# Patient Record
Sex: Female | Born: 1955 | Race: White | Hispanic: No | Marital: Married | Smoking: Never smoker
Health system: Southern US, Community
[De-identification: ages and names within clinical notes are randomized; demographics above are authoritative.]

## PROBLEM LIST (undated history)

## (undated) DIAGNOSIS — D259 Leiomyoma of uterus, unspecified: Secondary | ICD-10-CM

## (undated) DIAGNOSIS — I7090 Unspecified atherosclerosis: Secondary | ICD-10-CM

## (undated) DIAGNOSIS — R87619 Unspecified abnormal cytological findings in specimens from cervix uteri: Secondary | ICD-10-CM

## (undated) DIAGNOSIS — R809 Proteinuria, unspecified: Secondary | ICD-10-CM

## (undated) DIAGNOSIS — M810 Age-related osteoporosis without current pathological fracture: Secondary | ICD-10-CM

## (undated) DIAGNOSIS — D509 Iron deficiency anemia, unspecified: Secondary | ICD-10-CM

## (undated) DIAGNOSIS — R300 Dysuria: Secondary | ICD-10-CM

## (undated) DIAGNOSIS — Z87898 Personal history of other specified conditions: Secondary | ICD-10-CM

## (undated) HISTORY — DX: Proteinuria, unspecified: R80.9

## (undated) HISTORY — PX: TONGUE SURGERY: SHX810

## (undated) HISTORY — DX: Dysuria: R30.0

## (undated) HISTORY — DX: Unspecified abnormal cytological findings in specimens from cervix uteri: R87.619

## (undated) HISTORY — DX: Personal history of other specified conditions: Z87.898

## (undated) HISTORY — PX: UTERINE FIBROID EMBOLIZATION: SHX825

## (undated) HISTORY — DX: Iron deficiency anemia, unspecified: D50.9

## (undated) HISTORY — PX: BREAST LUMPECTOMY: SHX2

## (undated) HISTORY — DX: Leiomyoma of uterus, unspecified: D25.9

## (undated) HISTORY — DX: Unspecified atherosclerosis: I70.90

## (undated) HISTORY — DX: Age-related osteoporosis without current pathological fracture: M81.0

---

## 1998-07-08 ENCOUNTER — Other Ambulatory Visit: Admission: RE | Admit: 1998-07-08 | Discharge: 1998-07-08 | Payer: Self-pay | Admitting: *Deleted

## 1999-07-16 ENCOUNTER — Other Ambulatory Visit: Admission: RE | Admit: 1999-07-16 | Discharge: 1999-07-16 | Payer: Self-pay | Admitting: *Deleted

## 2001-01-25 ENCOUNTER — Other Ambulatory Visit: Admission: RE | Admit: 2001-01-25 | Discharge: 2001-01-25 | Payer: Self-pay | Admitting: *Deleted

## 2002-01-13 ENCOUNTER — Other Ambulatory Visit: Admission: RE | Admit: 2002-01-13 | Discharge: 2002-01-13 | Payer: Self-pay | Admitting: *Deleted

## 2005-10-07 ENCOUNTER — Ambulatory Visit: Payer: Self-pay | Admitting: Internal Medicine

## 2005-10-08 ENCOUNTER — Ambulatory Visit: Payer: Self-pay | Admitting: Internal Medicine

## 2005-12-21 LAB — HM COLONOSCOPY: HM Colonoscopy: NORMAL

## 2006-01-15 ENCOUNTER — Other Ambulatory Visit: Admission: RE | Admit: 2006-01-15 | Discharge: 2006-01-15 | Payer: Self-pay | Admitting: Obstetrics & Gynecology

## 2007-06-22 ENCOUNTER — Other Ambulatory Visit: Admission: RE | Admit: 2007-06-22 | Discharge: 2007-06-22 | Payer: Self-pay | Admitting: Obstetrics & Gynecology

## 2007-11-03 LAB — CONVERTED CEMR LAB: Pap Smear: NORMAL

## 2008-10-29 ENCOUNTER — Ambulatory Visit: Payer: Self-pay | Admitting: Internal Medicine

## 2008-10-29 DIAGNOSIS — D509 Iron deficiency anemia, unspecified: Secondary | ICD-10-CM | POA: Insufficient documentation

## 2008-10-29 DIAGNOSIS — M810 Age-related osteoporosis without current pathological fracture: Secondary | ICD-10-CM | POA: Insufficient documentation

## 2008-10-29 HISTORY — DX: Iron deficiency anemia, unspecified: D50.9

## 2008-10-29 HISTORY — DX: Age-related osteoporosis without current pathological fracture: M81.0

## 2008-10-30 ENCOUNTER — Encounter: Payer: Self-pay | Admitting: Internal Medicine

## 2008-10-30 LAB — CONVERTED CEMR LAB
AST: 24 units/L (ref 0–37)
Alkaline Phosphatase: 68 units/L (ref 39–117)
Basophils Absolute: 0 10*3/uL (ref 0.0–0.1)
Bilirubin, Direct: 0.1 mg/dL (ref 0.0–0.3)
Calcium: 9.9 mg/dL (ref 8.4–10.5)
Eosinophils Absolute: 0.1 10*3/uL (ref 0.0–0.7)
Eosinophils Relative: 1.5 % (ref 0.0–5.0)
GFR calc non Af Amer: 79.66 mL/min (ref >60)
Glucose, Bld: 117 mg/dL — ABNORMAL HIGH (ref 70–99)
HCT: 44.2 % (ref 36.0–46.0)
Ketones, ur: NEGATIVE mg/dL
Lymphs Abs: 1.8 10*3/uL (ref 0.7–4.0)
MCHC: 33.7 g/dL (ref 30.0–36.0)
MCV: 88.6 fL (ref 78.0–100.0)
Monocytes Absolute: 0.3 10*3/uL (ref 0.1–1.0)
Platelets: 211 10*3/uL (ref 150.0–400.0)
RDW: 11.9 % (ref 11.5–14.6)
Sodium: 141 meq/L (ref 135–145)
Specific Gravity, Urine: 1.015 (ref 1.000–1.030)
Total Bilirubin: 0.7 mg/dL (ref 0.3–1.2)
Total CHOL/HDL Ratio: 2
Urobilinogen, UA: 0.2 (ref 0.0–1.0)
Vit D, 25-Hydroxy: 60 ng/mL (ref 30–89)

## 2008-11-03 ENCOUNTER — Encounter: Payer: Self-pay | Admitting: Internal Medicine

## 2008-11-05 LAB — CONVERTED CEMR LAB: PTH: 63.8 pg/mL (ref 14.0–72.0)

## 2009-03-29 ENCOUNTER — Ambulatory Visit: Payer: Self-pay | Admitting: Internal Medicine

## 2009-03-29 DIAGNOSIS — R300 Dysuria: Secondary | ICD-10-CM | POA: Insufficient documentation

## 2009-03-29 HISTORY — DX: Dysuria: R30.0

## 2009-03-29 LAB — CONVERTED CEMR LAB
Bilirubin Urine: NEGATIVE
Glucose, Urine, Semiquant: NEGATIVE
Leukocytes, UA: NEGATIVE
Nitrite: NEGATIVE
Protein, U semiquant: NEGATIVE
Specific Gravity, Urine: 1.01
Urobilinogen, UA: 0.2
WBC Urine, dipstick: NEGATIVE
pH: 6.5

## 2009-03-30 ENCOUNTER — Encounter: Payer: Self-pay | Admitting: Internal Medicine

## 2010-03-06 NOTE — Assessment & Plan Note (Signed)
Summary: BLOOD IN URINE/NWS   Vital Signs:  Patient profile:   55 year old female Height:      65 inches Weight:      154.75 pounds BMI:     25.84 O2 Sat:      97 % on Room air Temp:     97.7 degrees F oral Pulse rate:   80 / minute BP sitting:   104 / 74  (left arm) Cuff size:   regular  Vitals Entered ByZella Ball Ewing (March 29, 2009 8:04 AM)  O2 Flow:  Room air CC: Blood in urine, urgency to urinate/RE   CC:  Blood in urine and urgency to urinate/RE.  History of Present Illness: had dysuria and small blood while traveling on feb 13, no fever, and mostly improved but still has some minor  urgency and frequency and some ? retention, and an unsual small stress incontinence;  no worsening back pain, n/v , chills or abd pain.  has been drinking more fluids and seemed to help.  No hx of renal stones and more pain than above.  Pt denies CP, sob, doe, wheezing, orthopnea, pnd, worsening LE edema, palps, dizziness or syncope  Pt denies new neuro symptoms such as headache, facial or extremity weakness  No GYn complaints or vaginal d/c or pain.    Problems Prior to Update: 1)  Dysuria  (ICD-788.1) 2)  Anemia-iron Deficiency  (ICD-280.9) 3)  Preventive Health Care  (ICD-V70.0) 4)  Osteoporosis  (ICD-733.00)  Medications Prior to Update: 1)  Fosamax 70 Mg Tabs (Alendronate Sodium) .... Once Weekly 2)  Calcium 1200gm 3)  Womens Multivitamin 4)  Fish Oil  Current Medications (verified): 1)  Evista 60 Mg Tabs (Raloxifene Hcl) .Marland Kitchen.. 1po Once Daily 2)  Calcium 1200gm 3)  Womens Multivitamin 4)  Fish Oil 5)  Evista 60 Mg Tabs (Raloxifene Hcl) .Marland Kitchen.. 1 By Mouth Once Daily  Allergies (verified): No Known Drug Allergies  Past History:  Past Medical History: Last updated: 10/29/2008 DJD hands Osteoporosis  - on Bisphosphonate for approx 2007 uterine fibroid Anemia-iron deficiency hx of fibrocystic breast dz  Past Surgical History: Last updated: 10/29/2008 s/p breast lump approx  1990 - benign - left s/p fibroid embolization  Social History: Last updated: 10/29/2008 Married no children work - Pensions consultant travel and Airline pilot Never Smoked Alcohol use-no  Risk Factors: Smoking Status: never (10/29/2008)  Review of Systems       all otherwise negative per pt -  Physical Exam  General:  alert and overweight-appearing.   Head:  normocephalic and atraumatic.   Eyes:  vision grossly intact, pupils equal, and pupils round.   Ears:  R ear normal and L ear normal.   Nose:  no external deformity and no nasal discharge.   Mouth:  no gingival abnormalities and pharynx pink and moist.   Neck:  supple and no masses.   Lungs:  normal respiratory effort and normal breath sounds.   Heart:  normal rate and regular rhythm.   Abdomen:  soft, non-tender, and normal bowel sounds.   Extremities:  no edema, no erythema    Impression & Recommendations:  Problem # 1:  DYSURIA (ICD-788.1)  minor , improved overall with small hematuria recently (trace pink per pt) with benign exam now;  no constitutional symptoms;  will check UA micro and cx , hold antibx pending culture results, and consider hematuria w/u for any further signs/pt agrees and declines ct/urology at this time  Orders: T-Culture, Urine ([id number])  TLB-Udip w/ Micro (81001-[id number])  Complete Medication List: 1)  Evista 60 Mg Tabs (Raloxifene hcl) .Marland Kitchen.. 1po once daily 2)  Calcium 1200gm  3)  Womens Multivitamin  4)  Fish Oil  5)  Evista 60 Mg Tabs (Raloxifene hcl) .Marland Kitchen.. 1 by mouth once daily  Other Orders: UA Dipstick W/ Micro (manual) (16109)  Patient Instructions: 1)  your urine will be sent for culture and micro analysis today 2)  Continue all previous medications as before this visit  3)  You will antibiotic sent to [facility] if culture shows UTI 4)  Please schedule a follow-up appointment as needed. Prescriptions: EVISTA 60 MG TABS (RALOXIFENE HCL) 1po once daily  #30 x 11   Entered and  Authorized by:   Corwin Levins MD   Signed by:   Corwin Levins MD on 03/29/2009   Method used:   Faxed to ...       [facility] (retail)       [phone]       [place], [place]  [address]       Ph: [phone]       Fax: [phone]   RxID:   [id number]   Laboratory Results   Urine Tests    Routine Urinalysis   Color: lt. yellow Appearance: Clear Glucose: negative   (Normal Range: Negative) Bilirubin: negative   (Normal Range: Negative) Ketone: negative   (Normal Range: Negative) Spec. Gravity: 1.010   (Normal Range: 1.003-1.035) Blood: moderate   (Normal Range: Negative) pH: 6.5   (Normal Range: 5.0-8.0) Protein: negative   (Normal Range: Negative) Urobilinogen: 0.2   (Normal Range: 0-1) Nitrite: negative   (Normal Range: Negative) Leukocyte Esterace: negative   (Normal Range: Negative)

## 2010-11-11 ENCOUNTER — Encounter: Payer: Self-pay | Admitting: Endocrinology

## 2010-11-11 ENCOUNTER — Ambulatory Visit (INDEPENDENT_AMBULATORY_CARE_PROVIDER_SITE_OTHER): Payer: BC Managed Care – PPO | Admitting: Endocrinology

## 2010-11-11 VITALS — BP 126/72 | HR 87 | Temp 98.8°F | Ht 66.0 in | Wt 155.0 lb

## 2010-11-11 DIAGNOSIS — J069 Acute upper respiratory infection, unspecified: Secondary | ICD-10-CM

## 2010-11-11 MED ORDER — DOXYCYCLINE HYCLATE 100 MG PO TABS: 100.0000 mg | ORAL_TABLET | Freq: Two times a day (BID) | ORAL | Status: AC

## 2010-11-11 MED ORDER — BENZONATATE 200 MG PO CAPS: 200.0000 mg | ORAL_CAPSULE | Freq: Three times a day (TID) | ORAL | Status: AC | PRN

## 2010-11-11 NOTE — Progress Notes (Signed)
ha Subjective:    Patient ID: Casey Conley, female    DOB: 14-Oct-1955, 55 y.o.   MRN: [id number]  HPI Pt states 3 days of slight prod-quality cough in the chest, and assoc purulent rhinorrhea.   Past Medical History  Diagnosis Date  . ANEMIA-IRON DEFICIENCY 10/29/2008  . Dysuria 03/29/2009  . OSTEOPOROSIS 10/29/2008  . Osteoporosis     on Bisphosphonate for aprox 2007  . Fibroid, uterine   . History of fibrocystic disease of breast     Past Surgical History  Procedure Date  . Uterine fibroid embolization     s/p  . Breast lumpectomy approx 1990    benign-left    History   Social History  . Marital Status: Married    Spouse Name: N/A    Number of Children: 0  . Years of Education: N/A   Occupational History  . Rug Importer/Frequent Travel and Sales    Social History Main Topics  . Smoking status: Never Smoker   . Smokeless tobacco: Not on file  . Alcohol Use: No  . Drug Use:   . Sexually Active:    Other Topics Concern  . Not on file   Social History Narrative   No siblings     No current outpatient prescriptions on file prior to visit.    No Known Allergies  Family History  Problem Relation Age of Onset  . Cancer Mother     Breast Cancer  . Arthritis Mother   . Dementia Mother   . Transient ischemic attack Mother   . Cancer Father     Lung Cancer, Colon Cancer  . Diabetes Father   . Hyperlipidemia Father   . Heart disease Father     CAD/CABG   BP 126/72  Pulse 87  Temp(Src) 98.8 F (37.1 C) (Oral)  Ht 5\' 6"  (1.676 m)  Wt 155 lb (70.308 kg)  BMI 25.02 kg/m2  SpO2 97%  Review of Systems Denies fever    Objective:   Physical Exam VITAL SIGNS:  See vs page GENERAL: no distress head: no deformity eyes: no periorbital swelling, no proptosis external nose and ears are normal mouth: pharynx is red and swollen Ears: Both eac's are normal.  Left tm is red (right is normal). NECK: There is no palpable thyroid enlargement.  No thyroid nodule is  palpable.  No palpable lymphadenopathy at the anterior neck. LUNGS:  Clear to auscultation     Assessment & Plan:  Glenford Peers, new

## 2010-11-11 NOTE — Patient Instructions (Signed)
i have sent a prescription to your pharmacy, for an antibiotic, and non-drowsy cough pills. Loratadine-d (non-prescription) will help your congestion. I hope you feel better soon.  If you don't feel better by next week, please call dr Jonny Ruiz.

## 2011-01-16 ENCOUNTER — Ambulatory Visit (INDEPENDENT_AMBULATORY_CARE_PROVIDER_SITE_OTHER): Payer: BC Managed Care – PPO

## 2011-01-16 DIAGNOSIS — Z23 Encounter for immunization: Secondary | ICD-10-CM

## 2011-01-19 ENCOUNTER — Ambulatory Visit: Payer: BC Managed Care – PPO | Admitting: Internal Medicine

## 2011-06-23 ENCOUNTER — Encounter: Payer: Self-pay | Admitting: Internal Medicine

## 2011-11-07 ENCOUNTER — Ambulatory Visit (INDEPENDENT_AMBULATORY_CARE_PROVIDER_SITE_OTHER): Payer: BC Managed Care – PPO | Admitting: Family Medicine

## 2011-11-07 VITALS — BP 142/90 | HR 74 | Temp 97.8°F | Resp 16 | Ht 66.0 in | Wt 155.0 lb

## 2011-11-07 DIAGNOSIS — R300 Dysuria: Secondary | ICD-10-CM

## 2011-11-07 LAB — POCT URINALYSIS DIPSTICK
Bilirubin, UA: NEGATIVE
Glucose, UA: NEGATIVE
Ketones, UA: NEGATIVE
Nitrite, UA: NEGATIVE
Protein, UA: NEGATIVE
Spec Grav, UA: 1.015
Urobilinogen, UA: 0.2
pH, UA: 7.5

## 2011-11-07 LAB — POCT UA - MICROSCOPIC ONLY
Bacteria, U Microscopic: NEGATIVE
Casts, Ur, LPF, POC: NEGATIVE
Crystals, Ur, HPF, POC: NEGATIVE
Mucus, UA: NEGATIVE
RBC, urine, microscopic: NEGATIVE
Yeast, UA: NEGATIVE

## 2011-11-07 MED ORDER — CIPROFLOXACIN HCL 500 MG PO TABS: 500.0000 mg | ORAL_TABLET | Freq: Two times a day (BID) | ORAL | Status: DC

## 2011-11-07 NOTE — Patient Instructions (Addendum)
Urinary Tract Infection Urinary tract infections (UTIs) can develop anywhere along your urinary tract. Your urinary tract is your body's drainage system for removing wastes and extra water. Your urinary tract includes two kidneys, two ureters, a bladder, and a urethra. Your kidneys are a pair of bean-shaped organs. Each kidney is about the size of your fist. They are located below your ribs, one on each side of your spine. CAUSES Infections are caused by microbes, which are microscopic organisms, including fungi, viruses, and bacteria. These organisms are so small that they can only be seen through a microscope. Bacteria are the microbes that most commonly cause UTIs. SYMPTOMS  Symptoms of UTIs may vary by age and gender of the patient and by the location of the infection. Symptoms in young women typically include a frequent and intense urge to urinate and a painful, burning feeling in the bladder or urethra during urination. Older women and men are more likely to be tired, shaky, and weak and have muscle aches and abdominal pain. A fever may mean the infection is in your kidneys. Other symptoms of a kidney infection include pain in your back or sides below the ribs, nausea, and vomiting. DIAGNOSIS To diagnose a UTI, your caregiver will ask you about your symptoms. Your caregiver also will ask to provide a urine sample. The urine sample will be tested for bacteria and white blood cells. White blood cells are made by your body to help fight infection. TREATMENT  Typically, UTIs can be treated with medication. Because most UTIs are caused by a bacterial infection, they usually can be treated with the use of antibiotics. The choice of antibiotic and length of treatment depend on your symptoms and the type of bacteria causing your infection. HOME CARE INSTRUCTIONS  If you were prescribed antibiotics, take them exactly as your caregiver instructs you. Finish the medication even if you feel better after you  have only taken some of the medication.  Drink enough water and fluids to keep your urine clear or pale yellow.  Avoid caffeine, tea, and carbonated beverages. They tend to irritate your bladder.  Empty your bladder often. Avoid holding urine for long periods of time.  Empty your bladder before and after sexual intercourse.  After a bowel movement, women should cleanse from front to back. Use each tissue only once. SEEK MEDICAL CARE IF:   You have back pain.  You develop a fever.  Your symptoms do not begin to resolve within 3 days. SEEK IMMEDIATE MEDICAL CARE IF:   You have severe back pain or lower abdominal pain.  You develop chills.  You have nausea or vomiting.  You have continued burning or discomfort with urination. MAKE SURE YOU:   Understand these instructions.  Will watch your condition.  Will get help right away if you are not doing well or get worse. Document Released: 10/29/2004 Document Revised: 07/21/2011 Document Reviewed: 02/27/2011 Exit[employer] Patient Information 2013 [employer].  

## 2011-11-07 NOTE — Progress Notes (Signed)
@[email or web address] @   Patient ID: Casey Conley MRN: [id number], DOB: [birth year], 56 y.o. Date of Encounter: 11/07/2011, 1:48 PM  Primary Physician: Oliver Barre, MD  Chief Complaint:  Chief Complaint  Patient presents with  . Dysuria  . Urinary Frequency    HPI: 56 y.o. year old female presents with 14  day history of dysuria, urgency, and frequency. Last UTI was never No hematuria No sick contacts, recent antibiotics, or recent travels.   No vaginal discharge, back pain, fever  Past Medical History  Diagnosis Date  . ANEMIA-IRON DEFICIENCY 10/29/2008  . Dysuria 03/29/2009  . OSTEOPOROSIS 10/29/2008  . Osteoporosis     on Bisphosphonate for aprox 2007  . Fibroid, uterine   . History of fibrocystic disease of breast      Home Meds: Prior to Admission medications   Medication Sig Start Date End Date Taking? Authorizing Provider  raloxifene (EVISTA) 60 MG tablet Take 60 mg by mouth daily.     Yes Historical Provider, MD  Calcium Carbonate-Vit D-Min (CALCIUM 1200 PO) Take by mouth.      Historical Provider, MD  fish oil-omega-3 fatty acids 1000 MG capsule Take 1 g by mouth daily.      Historical Provider, MD  Multiple Vitamins-Minerals (WOMENS MULTIVITAMIN PLUS) TABS Take 1 tablet by mouth daily.      Historical Provider, MD    Allergies: No Known Allergies  History   Social History  . Marital Status: Married    Spouse Name: N/A    Number of Children: 0  . Years of Education: N/A   Occupational History  . Rug Importer/Frequent Travel and Sales    Social History Main Topics  . Smoking status: Never Smoker   . Smokeless tobacco: Not on file  . Alcohol Use: No  . Drug Use:   . Sexually Active:    Other Topics Concern  . Not on file   Social History Narrative   No siblings      Review of Systems: Constitutional: negative for chills, fever, night sweats or weight changes Cardiovascular: negative for chest pain or palpitations Respiratory: negative for hemoptysis,  wheezing, or shortness of breath Abdominal: negative for abdominal pain, nausea, vomiting or diarrhea Dermatological: negative for rash Neurologic: negative for headache   Physical Exam: Blood pressure 142/90, pulse 74, temperature 97.8 F (36.6 C), temperature source Oral, resp. rate 16, height 5\' 6"  (1.676 m), weight 155 lb (70.308 kg)., Body mass index is 25.02 kg/(m^2). General: Well developed, well nourished, in no acute distress. Head: Normocephalic, atraumatic, eyes without discharge, sclera non-icteric, nares are congested. Bilateral auditory canals clear, TM's are without perforation, pearly grey with reflective cone of light bilaterally. Serous effusion bilaterally behind TM's. Maxillary sinus TTP. Oral cavity moist, dentition normal. Posterior pharynx with post nasal drip and mild erythema. No peritonsillar abscess or tonsillar exudate. Neck: Supple. No thyromegaly. Full ROM. No lymphadenopathy. Lungs: Coarse breath sounds bilaterally without Clear bilaterally to auscultation without wheezes, rales, or rhonchi. Breathing is unlabored.  Heart: RRR with S1 S2. No murmurs, rubs, or gallops appreciated. Abdomen: Soft, non-tender, non-distended with normoactive bowel sounds. No hepatosplenomegaly. No rebound/guarding. No obvious abdominal masses. McBurney's, Rovsing's, Iliopsoas, and table jar all negative. Msk:  Strength and tone normal for age. Extremities: No clubbing or cyanosis. No edema. Neuro: Alert and oriented X 3. Moves all extremities spontaneously. CNII-XII grossly in tact. Psych:  Responds to questions appropriately with a normal affect.   Labs: Results for orders placed in visit on 11/11/10  HM MAMMOGRAPHY      Component Value Range   HM Mammogram normal    HM COLONOSCOPY      Component Value Range   HM Colonoscopy normal     Results for orders placed in visit on 11/07/11  POCT URINALYSIS DIPSTICK      Component Value Range   Color, UA yellow     Clarity, UA clear      Glucose, UA neg     Bilirubin, UA neg     Ketones, UA neg     Spec Grav, UA 1.015     Blood, UA trace-intact     pH, UA 7.5     Protein, UA neg     Urobilinogen, UA 0.2     Nitrite, UA neg     Leukocytes, UA Trace       ASSESSMENT AND PLAN:  56 y.o. year old female with frequency and dysuria - -Mucinex -Tylenol/Motrin prn -Rest/fluids -RTC precautions -RTC 3-5 days if no improvement  Signed, Elvina Sidle, MD 11/07/2011 1:48 PM

## 2011-11-08 LAB — URINE CULTURE: Colony Count: 10000

## 2012-01-12 ENCOUNTER — Ambulatory Visit (INDEPENDENT_AMBULATORY_CARE_PROVIDER_SITE_OTHER): Payer: BC Managed Care – PPO

## 2012-01-12 DIAGNOSIS — Z23 Encounter for immunization: Secondary | ICD-10-CM

## 2012-06-01 ENCOUNTER — Encounter: Payer: Self-pay | Admitting: Internal Medicine

## 2012-06-01 ENCOUNTER — Other Ambulatory Visit (INDEPENDENT_AMBULATORY_CARE_PROVIDER_SITE_OTHER): Payer: BC Managed Care – PPO

## 2012-06-01 ENCOUNTER — Ambulatory Visit (INDEPENDENT_AMBULATORY_CARE_PROVIDER_SITE_OTHER): Payer: BC Managed Care – PPO | Admitting: Internal Medicine

## 2012-06-01 VITALS — BP 112/70 | HR 76 | Temp 97.8°F | Ht 65.0 in | Wt 159.5 lb

## 2012-06-01 DIAGNOSIS — R635 Abnormal weight gain: Secondary | ICD-10-CM

## 2012-06-01 DIAGNOSIS — Z0000 Encounter for general adult medical examination without abnormal findings: Secondary | ICD-10-CM

## 2012-06-01 DIAGNOSIS — Z0001 Encounter for general adult medical examination with abnormal findings: Secondary | ICD-10-CM | POA: Insufficient documentation

## 2012-06-01 DIAGNOSIS — E559 Vitamin D deficiency, unspecified: Secondary | ICD-10-CM | POA: Insufficient documentation

## 2012-06-01 DIAGNOSIS — B370 Candidal stomatitis: Secondary | ICD-10-CM

## 2012-06-01 LAB — BASIC METABOLIC PANEL
BUN: 19 mg/dL (ref 6–23)
CO2: 27 mEq/L (ref 19–32)
Calcium: 9.1 mg/dL (ref 8.4–10.5)
GFR: 111.71 mL/min (ref >60.00)
Glucose, Bld: 94 mg/dL (ref 70–99)
Potassium: 3.9 mEq/L (ref 3.5–5.1)

## 2012-06-01 LAB — CBC WITH DIFFERENTIAL/PLATELET
Basophils Absolute: 0 10*3/uL (ref 0.0–0.1)
Eosinophils Relative: 1.1 % (ref 0.0–5.0)
HCT: 41.6 % (ref 36.0–46.0)
Lymphocytes Relative: 36.8 % (ref 12.0–46.0)
Lymphs Abs: 2.1 10*3/uL (ref 0.7–4.0)
Monocytes Relative: 8.9 % (ref 3.0–12.0)
Neutrophils Relative %: 52.8 % (ref 43.0–77.0)
Platelets: 216 10*3/uL (ref 150.0–400.0)
RDW: 13.6 % (ref 11.5–14.6)
WBC: 5.6 10*3/uL (ref 4.5–10.5)

## 2012-06-01 LAB — URINALYSIS, ROUTINE W REFLEX MICROSCOPIC
Leukocytes, UA: NEGATIVE
Nitrite: NEGATIVE
Specific Gravity, Urine: 1.02 (ref 1.000–1.030)
Urobilinogen, UA: 0.2 (ref 0.0–1.0)
pH: 7 (ref 5.0–8.0)

## 2012-06-01 LAB — HEPATIC FUNCTION PANEL
AST: 31 U/L (ref 0–37)
Albumin: 3.9 g/dL (ref 3.5–5.2)
Total Protein: 7.1 g/dL (ref 6.0–8.3)

## 2012-06-01 LAB — LIPID PANEL
HDL: 93 mg/dL (ref >39.00)
VLDL: 11.4 mg/dL (ref 0.0–40.0)

## 2012-06-01 LAB — TSH: TSH: 2.49 u[IU]/mL (ref 0.35–5.50)

## 2012-06-01 MED ORDER — NYSTATIN 100000 UNIT/ML MT SUSP: 500000.0000 [IU] | Freq: Four times a day (QID) | OROMUCOSAL | Status: DC

## 2012-06-01 NOTE — Assessment & Plan Note (Signed)
For nystatin asd,  to f/u any worsening symptoms or concerns  

## 2012-06-01 NOTE — Patient Instructions (Addendum)
OK to stay off the terbinifine Please take all new medication as prescribed  - the nystatin solution You can also try OTC benadryl 50 mg in case the tongue swelling has an allergy part to it We will call Dr Rondel Baton office to get your recent labs Please continue all other medications as before, and refills have been done if requested. Please have the pharmacy call with any other refills you may need. Please continue your efforts at being more active, low cholesterol diet, and weight control. You are otherwise up to date with prevention measures today. Please go to the LAB in the Basement (turn left off the elevator) for the tests to be done today, including the 24 hr urine to make sure no Cushings syndrome You will be contacted by phone if any changes need to be made immediately.  Otherwise, you will receive a letter about your results with an explanation, but please check with MyChart first. Thank you for enrolling in MyChart. Please follow the instructions below to securely access your online medical record. MyChart allows you to send messages to your doctor, view your test results, renew your prescriptions, schedule appointments, and more. To Log into My Chart online, please go by [employer] or [employer] to [email or web address], or download the MyChart App from the [employer] of [employer] .  Your Username is: [email or web address] (pass jake) Please return in 6 months, or sooner if needed

## 2012-06-01 NOTE — Assessment & Plan Note (Signed)
Somewhat suggestive of cushing like - for 24 hr urine free cortisol

## 2012-06-01 NOTE — Progress Notes (Signed)
Subjective:    Patient ID: Casey Conley, female    DOB: 1955/09/15, 57 y.o.   MRN: [id number]  HPI  Here for wellness and f/u;  Overall doing ok;  Pt denies CP, worsening SOB, DOE, wheezing, orthopnea, PND, worsening LE edema, palpitations, dizziness or syncope.  Pt denies neurological change such as new headache, facial or extremity weakness.  Pt denies polydipsia, polyuria, or low sugar symptoms. Pt states overall good compliance with treatment and medications, good tolerability, and has been trying to follow lower cholesterol diet.  Pt denies worsening depressive symptoms, suicidal ideation or panic. No fever, night sweats, wt loss, loss of appetite, or other constitutional symptoms.  Pt states good ability with ADL's, has low fall risk, home safety reviewed and adequate, no other significant changes in hearing or vision, and only occasionally active with exercise.  Large appetite, gained 7 lbs in 2-3 wks, occas episodes weakness and lightheaded, eating seems to make better.  Also some left jaw aching and ? Tongue swelling on the left, eats on the right.  On terbinafine 250 mg since apr 23, had all other symptoms prior to start, except the tongue swelling. No rash, throat swelling, sob or wheeze.  Has seasonal allergies mild as well, otc Allerclear works well. Wants her thyroid checked.  Last seen per GYN jan 2014 with routine labs but she only recalls a glc of 110.  Did stop the terbinifine but the tongue feeling persists.  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  Pain better with alleve, hand braces, and rest. 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  Past Medical History  Diagnosis Date  . ANEMIA-IRON DEFICIENCY 10/29/2008  . Dysuria 03/29/2009  . OSTEOPOROSIS 10/29/2008  . Osteoporosis     on Bisphosphonate for aprox 2007  . Fibroid, uterine   . History of  fibrocystic disease of breast    Past Surgical History  Procedure Laterality Date  . Uterine fibroid embolization      s/p  . Breast lumpectomy  approx 1990    benign-left    reports that she has never smoked. She does not have any smokeless tobacco history on file. She reports that she does not drink alcohol. Her drug history is not on file. family history includes Arthritis in her mother; Cancer in her father and mother; Dementia in her mother; Diabetes in her father; Heart disease in her father; Hyperlipidemia in her father; and Transient ischemic attack in her mother. No Known Allergies Current Outpatient Prescriptions on File Prior to Visit  Medication Sig Dispense Refill  . Calcium Carbonate-Vit D-Min (CALCIUM 1200 PO) Take by mouth.        . raloxifene (EVISTA) 60 MG tablet Take 60 mg by mouth daily.         No current facility-administered medications on file prior to visit.   Review of Systems Constitutional: Negative for diaphoresis, activity change, appetite change or unexpected weight change.  HENT: Negative for hearing loss, ear pain, facial swelling, mouth sores and neck stiffness.   Eyes: Negative for pain, redness and visual disturbance.  Respiratory: Negative for shortness of breath and wheezing.   Cardiovascular: Negative for chest pain and palpitations.  Gastrointestinal: Negative for diarrhea, blood in stool, abdominal distention or other pain Genitourinary: Negative for hematuria, flank pain or change in urine volume.  Musculoskeletal: Negative  for myalgias and joint swelling.  Skin: Negative for color change and wound.  Neurological: Negative for syncope and numbness. other than noted Hematological: Negative for adenopathy.  Psychiatric/Behavioral: Negative for hallucinations, self-injury, decreased concentration and agitation.      Objective:   Physical Exam BP 112/70  Pulse 76  Temp(Src) 97.8 F (36.6 C) (Oral)  Ht 5\' 5"  (1.651 m)  Wt 159 lb 8 oz  (72.349 kg)  BMI 26.54 kg/m2  SpO2 97% VS noted,  Constitutional: Pt is oriented to person, place, and time. Appears well-developed and well-nourished.  Head: Normocephalic and atraumatic.  Right Ear: External ear normal.  Left Ear: External ear normal.  Nose: Nose normal.  Mouth/Throat: Oropharynx is clear and moist. tongue with white rash Eyes: Conjunctivae and EOM are normal. Pupils are equal, round, and reactive to light.  Neck: Normal range of motion. Neck supple. No JVD present. No tracheal deviation present.  Cardiovascular: Normal rate, regular rhythm, normal heart sounds and intact distal pulses.   Pulmonary/Chest: Effort normal and breath sounds normal.  Abdominal: Soft. Bowel sounds are normal. There is no tenderness. No HSM  Musculoskeletal: Normal range of motion. Exhibits no edema.  Lymphadenopathy:  Has no cervical adenopathy.  Neurological: Pt is alert and oriented to person, place, and time. Pt has normal reflexes. No cranial nerve deficit.  Skin: Skin is warm and dry. No rash noted.  Psychiatric:  Has  normal mood and affect. Behavior is normal. 1-2+ nervous    Assessment & Plan:

## 2012-06-01 NOTE — Assessment & Plan Note (Signed)

## 2012-06-09 LAB — CORTISOL, URINE, FREE

## 2012-06-13 ENCOUNTER — Other Ambulatory Visit: Payer: Self-pay | Admitting: Internal Medicine

## 2012-06-13 DIAGNOSIS — R82998 Other abnormal findings in urine: Secondary | ICD-10-CM

## 2012-06-14 ENCOUNTER — Telehealth: Payer: Self-pay

## 2012-06-14 DIAGNOSIS — R635 Abnormal weight gain: Secondary | ICD-10-CM

## 2012-06-14 NOTE — Telephone Encounter (Signed)
Called left message to call back 

## 2012-06-14 NOTE — Telephone Encounter (Signed)
Patient informed. 

## 2012-06-14 NOTE — Telephone Encounter (Signed)
The patient called needing clarification regarding her endo referral.  She has now seen the mychart message and does understand results, however she did not do the cortisol urine 24 hr. Test that was ordered.  She stated the lab tech was unsure about the test.  Does she need to come in for that test?

## 2012-06-14 NOTE — Telephone Encounter (Signed)
If the 24 hr urine test was not done correctly, it should be done that way.  OK to cancel the endo referral for now, and I will re-order the urine test

## 2012-06-16 ENCOUNTER — Other Ambulatory Visit: Payer: BC Managed Care – PPO

## 2012-06-16 DIAGNOSIS — R635 Abnormal weight gain: Secondary | ICD-10-CM

## 2012-06-17 ENCOUNTER — Ambulatory Visit: Payer: BC Managed Care – PPO | Admitting: Internal Medicine

## 2012-06-20 ENCOUNTER — Encounter: Payer: Self-pay | Admitting: Internal Medicine

## 2012-06-21 LAB — CORTISOL, URINE, 24 HOUR: RESULTS RECEIVED: 1.17 g/(24.h) (ref 0.63–2.50)

## 2012-07-19 ENCOUNTER — Telehealth: Payer: Self-pay | Admitting: Obstetrics & Gynecology

## 2012-07-19 ENCOUNTER — Other Ambulatory Visit: Payer: Self-pay | Admitting: Obstetrics & Gynecology

## 2012-07-19 NOTE — Telephone Encounter (Signed)
Patient is requesting her Rx for Avista be rewritten thru mail order #90 X 3 refills because is is not as costly  Her subscriber # [phone]  Fax number # [phone]

## 2012-07-20 NOTE — Telephone Encounter (Signed)
Refill request for EVISTA.  Pt is requesting 90day supply to new pharmacy. Last filled by MD on - 02/17/12, #30 x 12 Last AEX - 02/17/12 Next AEX - 05/19/13 Message left for patient to return call for pharmacy name.

## 2012-07-20 NOTE — Telephone Encounter (Signed)
Called patient back, got voicemail asked if she can just let us know the name of pharmacy as well.

## 2012-07-20 NOTE — Telephone Encounter (Signed)
Patient returning Stephanie's call.

## 2012-07-21 MED ORDER — RALOXIFENE HCL 60 MG PO TABS: 60.0000 mg | ORAL_TABLET | Freq: Every day | ORAL | Status: DC

## 2012-07-21 NOTE — Telephone Encounter (Signed)
RC from patient.  She uses [facility].  RX called to pharmacy.

## 2012-10-28 ENCOUNTER — Encounter: Payer: Self-pay | Admitting: Obstetrics & Gynecology

## 2012-11-30 ENCOUNTER — Ambulatory Visit: Payer: BC Managed Care – PPO | Admitting: Internal Medicine

## 2012-12-08 ENCOUNTER — Other Ambulatory Visit: Payer: Self-pay

## 2012-12-21 ENCOUNTER — Ambulatory Visit (INDEPENDENT_AMBULATORY_CARE_PROVIDER_SITE_OTHER): Payer: BC Managed Care – PPO

## 2012-12-21 DIAGNOSIS — Z23 Encounter for immunization: Secondary | ICD-10-CM

## 2013-02-27 ENCOUNTER — Ambulatory Visit: Payer: BC Managed Care – PPO | Admitting: Obstetrics & Gynecology

## 2013-03-20 ENCOUNTER — Encounter: Payer: Self-pay | Admitting: Obstetrics & Gynecology

## 2013-03-21 ENCOUNTER — Encounter: Payer: Self-pay | Admitting: Obstetrics & Gynecology

## 2013-03-21 ENCOUNTER — Ambulatory Visit (INDEPENDENT_AMBULATORY_CARE_PROVIDER_SITE_OTHER): Payer: BC Managed Care – PPO | Admitting: Obstetrics & Gynecology

## 2013-03-21 ENCOUNTER — Ambulatory Visit: Payer: BC Managed Care – PPO | Admitting: Obstetrics & Gynecology

## 2013-03-21 VITALS — BP 122/96 | HR 90 | Resp 18 | Ht 65.0 in | Wt 158.0 lb

## 2013-03-21 DIAGNOSIS — D219 Benign neoplasm of connective and other soft tissue, unspecified: Secondary | ICD-10-CM

## 2013-03-21 DIAGNOSIS — D259 Leiomyoma of uterus, unspecified: Secondary | ICD-10-CM

## 2013-03-21 DIAGNOSIS — Z01419 Encounter for gynecological examination (general) (routine) without abnormal findings: Secondary | ICD-10-CM

## 2013-03-21 DIAGNOSIS — Z124 Encounter for screening for malignant neoplasm of cervix: Secondary | ICD-10-CM

## 2013-03-21 DIAGNOSIS — Z0000 Encounter for general adult medical examination without abnormal findings: Secondary | ICD-10-CM

## 2013-03-21 LAB — HEMOGLOBIN, FINGERSTICK: Hemoglobin, fingerstick: 15.2 g/dL (ref 12.0–16.0)

## 2013-03-21 MED ORDER — ERGOCALCIFEROL 1.25 MG (50000 UT) PO CAPS: 50000.0000 [IU] | ORAL_CAPSULE | ORAL | Status: DC

## 2013-03-21 NOTE — Progress Notes (Signed)
58 y.o. G0P0000 MarriedCaucasianF here for annual exam.  No vaginal bleeding.  Doing well.  Cousin on tamoxifen had a stroke.  Had hole in her heart.  Patient will find out more details for me about this.    Patient's last menstrual period was 09/02/2004.          Sexually active: no  The current method of family planning is none.    Exercising: yes  walking but not regularly Smoker:  no  Health Maintenance: Pap:  02/11/11 WNL/negative HR HPV History of abnormal Pap:  no MMG:  01/23/13 normal Colonoscopy:  2013 repeat in 5 years-Dr Lavina Hamman with [facility] BMD:   01/22/12-will have one this 12/15 at [place]:  2007 Screening Labs: 4/13, Hb today: 15.2, Urine today: WBC-trace, PROTEIN-trace, RBC-trace   reports that she has never smoked. She has never used smokeless tobacco. She reports that she does not drink alcohol or use illicit drugs.  Past Medical History  Diagnosis Date  . ANEMIA-IRON DEFICIENCY 10/29/2008  . Dysuria 03/29/2009  . OSTEOPOROSIS 10/29/2008  . Osteoporosis     on Bisphosphonate for aprox 2007  . Fibroid, uterine   . History of fibrocystic disease of breast   . Proteinuria     24 hour-normal    Past Surgical History  Procedure Laterality Date  . Uterine fibroid embolization      s/p  . Breast lumpectomy  approx 1990    benign-left    Current Outpatient Prescriptions  Medication Sig Dispense Refill  . Calcium Carbonate-Vit D-Min (CALCIUM 1200 PO) Take by mouth.        . ergocalciferol (VITAMIN D2) 50000 UNITS capsule Take 50,000 Units by mouth every 14 (fourteen) days.       . raloxifene (EVISTA) 60 MG tablet Take 1 tablet (60 mg total) by mouth daily.  90 tablet  1  . omeprazole (PRILOSEC) 20 MG capsule Take 20 mg by mouth daily.       No current facility-administered medications for this visit.    Family History  Problem Relation Age of Onset  . Cancer Mother     Breast Cancer  . Arthritis Mother   . Dementia Mother   . Transient ischemic  attack Mother   . Cancer Father     Lung Cancer, Colon Cancer  . Diabetes Father   . Hyperlipidemia Father   . Heart disease Father     CAD/CABG  . Diabetes Paternal Grandfather   . Cancer Maternal Grandmother     cecum cancer  . Cancer Paternal Grandmother     "female"  . Heart disease Paternal Grandfather   . Osteoporosis Mother   . Colon cancer Maternal Uncle     ROS:  Pertinent items are noted in HPI.  Otherwise, a comprehensive ROS was negative.  Exam:   BP 122/96  Pulse 90  Resp 18  Ht 5\' 5"  (1.651 m)  Wt 158 lb (71.668 kg)  BMI 26.29 kg/m2  LMP 09/02/2004  Weight change: +2lb  Height: 5\' 5"  (165.1 cm)  Ht Readings from Last 3 Encounters:  03/21/13 5\' 5"  (1.651 m)  06/01/12 5\' 5"  (1.651 m)  11/07/11 5\' 6"  (1.676 m)    General appearance: alert, cooperative and appears stated age Head: Normocephalic, without obvious abnormality, atraumatic Neck: no adenopathy, supple, symmetrical, trachea midline and thyroid normal to inspection and palpation Lungs: clear to auscultation bilaterally Breasts: normal appearance, no masses or tenderness Heart: regular rate and rhythm Abdomen: soft, non-tender; bowel  sounds normal; no masses,  no organomegaly Extremities: extremities normal, atraumatic, no cyanosis or edema Skin: Skin color, texture, turgor normal. No rashes or lesions Lymph nodes: Cervical, supraclavicular, and axillary nodes normal. No abnormal inguinal nodes palpated Neurologic: Grossly normal   Pelvic: External genitalia:  no lesions              Urethra:  normal appearing urethra with no masses, tenderness or lesions              Bartholins and Skenes: normal                 Vagina: normal appearing vagina with normal color and discharge, no lesions              Cervix: no lesions              Pap taken: yes Bimanual Exam:  Uterus:  normal size, contour, position, consistency, mobility, non-tender              Adnexa: normal adnexa and no mass, fullness,  tenderness               Rectovaginal: Confirms               Anus:  normal sphincter tone, no lesions  A:  Well Woman with normal exam PMP, no HRT Osteopenia Low Vitamin D H/O fibroids, s/p [place] 2005  P:   Mammogram yearly.  Grade 3 breast density. pap smear obtained today Plan PUS.  H/O fibroids Pt will need evista RX.  Pt to call with mail order pharmacy Vit D 50K every other week.  Rx to pharmacy. return annually or prn  An After Visit Summary was printed and given to the patient.

## 2013-03-21 NOTE — Progress Notes (Signed)
PUS scheduled for 04-11-13 in office. Patient declined earlier appointment.

## 2013-03-21 NOTE — Patient Instructions (Signed)

## 2013-03-23 LAB — IPS PAP SMEAR ONLY

## 2013-03-28 ENCOUNTER — Telehealth: Payer: Self-pay | Admitting: Obstetrics & Gynecology

## 2013-03-28 NOTE — Telephone Encounter (Signed)
Patient needs to reschedule her PUS appointment. ( appts cx)

## 2013-04-11 ENCOUNTER — Other Ambulatory Visit: Payer: BC Managed Care – PPO | Admitting: Obstetrics & Gynecology

## 2013-04-11 ENCOUNTER — Other Ambulatory Visit: Payer: BC Managed Care – PPO

## 2013-05-11 ENCOUNTER — Telehealth: Payer: Self-pay | Admitting: Obstetrics & Gynecology

## 2013-05-11 MED ORDER — RALOXIFENE HCL 60 MG PO TABS: 60.0000 mg | ORAL_TABLET | Freq: Every day | ORAL | Status: DC

## 2013-05-11 NOTE — Telephone Encounter (Signed)
Spoke with patient. Advised Evista 60 mg sent to [facility] as was stated in last office visit notes from [place] on 03/21/13 that patient would need prescription sent when she called to clarify. Patient notified of lab results from 2/17. Was under the impression more lab work was drawn. Advised labs completed on 2/17 were only hemoglobin. Advised if she has any concerns or would like further lab work done I could check with Dr.Miller to have patient possibly come in for further testing. Patient states "No I am fine I was just confused about what needed to be done. If she did not order them last visit then I don't think I need them."  Advised to call back with any further questions or concerns.  Routing to provider for final review. Patient agreeable to disposition. Will close encounter

## 2013-05-11 NOTE — Telephone Encounter (Signed)
Pt is requesting a 90 day supply for raloxifene (EVISTA) 60 MG tablet. Pt is using [facility]. Pt. would like to get her most recent bloodwork results. She states she cannot find it on My chart and she did not receive a call with the results.

## 2013-05-16 ENCOUNTER — Ambulatory Visit: Payer: BC Managed Care – PPO | Admitting: Obstetrics & Gynecology

## 2013-05-17 ENCOUNTER — Ambulatory Visit: Payer: BC Managed Care – PPO | Admitting: Obstetrics & Gynecology

## 2013-05-19 ENCOUNTER — Ambulatory Visit: Payer: BC Managed Care – PPO | Admitting: Obstetrics & Gynecology

## 2013-05-30 ENCOUNTER — Encounter: Payer: Self-pay | Admitting: Obstetrics & Gynecology

## 2013-05-30 ENCOUNTER — Ambulatory Visit (INDEPENDENT_AMBULATORY_CARE_PROVIDER_SITE_OTHER): Payer: BC Managed Care – PPO | Admitting: Obstetrics & Gynecology

## 2013-05-30 ENCOUNTER — Ambulatory Visit (INDEPENDENT_AMBULATORY_CARE_PROVIDER_SITE_OTHER): Payer: BC Managed Care – PPO

## 2013-05-30 VITALS — BP 122/84 | Ht 65.0 in | Wt 160.6 lb

## 2013-05-30 DIAGNOSIS — D219 Benign neoplasm of connective and other soft tissue, unspecified: Secondary | ICD-10-CM

## 2013-05-30 DIAGNOSIS — D259 Leiomyoma of uterus, unspecified: Secondary | ICD-10-CM

## 2013-05-30 NOTE — Progress Notes (Signed)
58 y.o.Marriedfemale here for a pelvic ultrasound.  Patient with hx of uterine artery embolization in 2005 due to fibroids.  She has some anxiety about ovarian cancer.  Is here for PUS primarily for this reason.  Patient's last menstrual period was 09/02/2004.  Sexually active:  yes  Contraception: PMP  FINDINGS: UTERUS:  6.5 x 3.5 x 2.3cm with since 1.1cm fibroid noted.  Significant calcifications throughout myometrium.  Simple appearing fluid in cervical canal. EMS: 1.69mm ADNEXA:   Left ovary 1.7 x 0.8 x 0.9cm   Right ovary 1.9 x 1.1 x 1.2cm, both ovaries atrophic CUL DE SAC:  No free fluid  D/W pt results and images reviewed.  Findings are consistent with hx of [place].  Pt reassured.  Assessment:  Uterine fibroid, cervical fluid Plan: repeat PUS prn.  AEX 1 year.  ~15 minutes spent with patient >50% of time was in face to face discussion of above.     Marland Kitchen

## 2014-01-31 ENCOUNTER — Other Ambulatory Visit: Payer: Self-pay

## 2014-01-31 MED ORDER — ERGOCALCIFEROL 1.25 MG (50000 UT) PO CAPS: 50000.0000 [IU] | ORAL_CAPSULE | ORAL | Status: DC

## 2014-01-31 NOTE — Telephone Encounter (Signed)
No.  Keep doing what she is doing.

## 2014-01-31 NOTE — Telephone Encounter (Signed)
Patient notified of BMD results. Would like to know if there is anything else she can do-she is using a shake that has calcium in it and taking her Vitamin D every other week. She did run out and would like new Rx called to [facility]. Order pending. Also, advised she could include some weight bearing exercises in with this. She was asking about a calcium injection. Please advise if any other suggestions.//kn

## 2014-02-19 NOTE — Telephone Encounter (Signed)
Patient aware to continue to do what she is doing.//kn

## 2014-04-03 ENCOUNTER — Ambulatory Visit: Payer: BC Managed Care – PPO | Admitting: Obstetrics & Gynecology

## 2014-04-03 ENCOUNTER — Ambulatory Visit: Payer: BC Managed Care – PPO | Admitting: Nurse Practitioner

## 2014-05-30 ENCOUNTER — Encounter: Payer: Self-pay | Admitting: Certified Nurse Midwife

## 2014-05-30 ENCOUNTER — Ambulatory Visit (INDEPENDENT_AMBULATORY_CARE_PROVIDER_SITE_OTHER): Payer: BLUE CROSS/BLUE SHIELD | Admitting: Certified Nurse Midwife

## 2014-05-30 VITALS — BP 104/64 | HR 70 | Resp 16 | Ht 64.75 in | Wt 155.0 lb

## 2014-05-30 DIAGNOSIS — Z01419 Encounter for gynecological examination (general) (routine) without abnormal findings: Secondary | ICD-10-CM | POA: Diagnosis not present

## 2014-05-30 DIAGNOSIS — Z803 Family history of malignant neoplasm of breast: Secondary | ICD-10-CM

## 2014-05-30 DIAGNOSIS — M810 Age-related osteoporosis without current pathological fracture: Secondary | ICD-10-CM | POA: Diagnosis not present

## 2014-05-30 DIAGNOSIS — Z0000 Encounter for general adult medical examination without abnormal findings: Secondary | ICD-10-CM | POA: Diagnosis not present

## 2014-05-30 LAB — POCT URINALYSIS DIPSTICK
Bilirubin, UA: NEGATIVE
Blood, UA: NEGATIVE
Glucose, UA: NEGATIVE
Ketones, UA: NEGATIVE
LEUKOCYTES UA: NEGATIVE
Nitrite, UA: NEGATIVE
PH UA: 5
PROTEIN UA: NEGATIVE
Urobilinogen, UA: NEGATIVE

## 2014-05-30 MED ORDER — RALOXIFENE HCL 60 MG PO TABS: 60.0000 mg | ORAL_TABLET | Freq: Every day | ORAL | Status: DC

## 2014-05-30 NOTE — Progress Notes (Signed)
59 y.o. G0P0000 Married  Caucasian Fe here for annual exam. Menopausal no HRT, denies vaginal bleeding or vaginal dryness. Sees Dr Jenny Reichmann as PCP and sees for aex and labs this year. BMD in 12/15 still shows osteoporosis and feels Evista is still a good choice for her. Needs Rx. Working with [facility] program for weight loss. No other health issues today.  Patient's last menstrual period was 09/02/2004.          Sexually active: No.  The current method of family planning is post menopausal status.    Exercising: Yes.    walking Smoker:  no  Health Maintenance: Pap: 03-21-13 neg, no endos MMG: 01-22-14 category c density,birads 2:neg  3D recommended yearly Colonoscopy: 2013 f/u 84yrs BMD:   2015 TDaP:  2007 Labs: Poct urine-neg Self breast exam: not done   reports that she has never smoked. She has never used smokeless tobacco. She reports that she does not drink alcohol or use illicit drugs.  Past Medical History  Diagnosis Date  . ANEMIA-IRON DEFICIENCY 10/29/2008  . Dysuria 03/29/2009  . OSTEOPOROSIS 10/29/2008  . Osteoporosis     on Bisphosphonate for aprox 2007  . Fibroid, uterine   . History of fibrocystic disease of breast   . Proteinuria     24 hour-normal    Past Surgical History  Procedure Laterality Date  . Uterine fibroid embolization      s/p  . Breast lumpectomy  approx 1990    benign-left    Current Outpatient Prescriptions  Medication Sig Dispense Refill  . Calcium Carbonate-Vit D-Min (CALCIUM 1200 PO) Take by mouth.      . ergocalciferol (VITAMIN D2) 50000 UNITS capsule Take 1 capsule (50,000 Units total) by mouth every 14 (fourteen) days. 6 capsule 2  . UNABLE TO FIND as needed. Her[employer] vitamins     No current facility-administered medications for this visit.    Family History  Problem Relation Age of Onset  . Cancer Mother     Breast Cancer  . Arthritis Mother   . Dementia Mother   . Transient ischemic attack Mother   . Cancer Father     Lung  Cancer, Colon Cancer  . Diabetes Father   . Hyperlipidemia Father   . Heart disease Father     CAD/CABG  . Diabetes Paternal Grandfather   . Cancer Maternal Grandmother     cecum cancer  . Cancer Paternal Grandmother     "female"  . Heart disease Paternal Grandfather   . Osteoporosis Mother   . Colon cancer Maternal Uncle     ROS:  Pertinent items are noted in HPI.  Otherwise, a comprehensive ROS was negative.  Exam:   BP 104/64 mmHg  Pulse 70  Resp 16  Ht 5' 4.75" (1.645 m)  Wt 155 lb (70.308 kg)  BMI 25.98 kg/m2  LMP 09/02/2004 Height: 5' 4.75" (164.5 cm) Ht Readings from Last 3 Encounters:  05/30/14 5' 4.75" (1.645 m)  05/30/13 5\' 5"  (1.651 m)  03/21/13 5\' 5"  (1.651 m)    General appearance: alert, cooperative and appears stated age Head: Normocephalic, without obvious abnormality, atraumatic Neck: no adenopathy, supple, symmetrical, trachea midline and thyroid normal to inspection and palpation Lungs: clear to auscultation bilaterally Breasts: normal appearance, no masses or tenderness, No nipple retraction or dimpling, No nipple discharge or bleeding, No axillary or supraclavicular adenopathy Heart: regular rate and rhythm Abdomen: soft, non-tender; no masses,  no organomegaly Extremities: extremities normal, atraumatic, no cyanosis or edema  Skin: Skin color, texture, turgor normal. No rashes or lesions Lymph nodes: Cervical, supraclavicular, and axillary nodes normal. No abnormal inguinal nodes palpated Neurologic: Grossly normal   Pelvic: External genitalia:  no lesions              Urethra:  normal appearing urethra with no masses, tenderness or lesions              Bartholin's and Skene's: normal                 Vagina: normal appearing vagina with normal color and discharge, no lesions              Cervix: normal, non tender, no lesions              Pap taken: Yes.   Bimanual Exam:  Uterus:  normal size, contour, position, consistency, mobility,  non-tender              Adnexa: normal adnexa and no mass, fullness, tenderness               Rectovaginal: Confirms               Anus:  normal sphincter tone, no lesions  Chaperone present: Yes  A:  Well Woman with normal exam  Menopausal no HRT  Osteoporosis with Evista use, requests continuance  Weight loss with [facility] now with weight loss  P:   Reviewed health and wellness pertinent to exam  Aware of need to evaluate if vaginal bleeding  Discussed risks and benefits of Evista, patient would like to continue and will advise if any changes  Rx Evista see order  Continue PCP yearly visits with labs.  Pap smear not taken today   counseled on breast self exam, mammography screening, adequate intake of calcium and vitamin D, diet and exercise  return annually or prn  An After Visit Summary was printed and given to the patient.

## 2014-05-30 NOTE — Patient Instructions (Signed)

## 2014-06-01 NOTE — Progress Notes (Signed)
Encounter reviewed by Dr. Alexandr Yaworski Silva.  

## 2014-06-06 ENCOUNTER — Telehealth: Payer: Self-pay | Admitting: Certified Nurse Midwife

## 2014-06-06 DIAGNOSIS — Z803 Family history of malignant neoplasm of breast: Secondary | ICD-10-CM

## 2014-06-06 MED ORDER — RALOXIFENE HCL 60 MG PO TABS: 60.0000 mg | ORAL_TABLET | Freq: Every day | ORAL | Status: DC

## 2014-06-06 NOTE — Telephone Encounter (Signed)
Spoke with patient. Patient states that she just got her Evista through [facility] and for 30 days it is going to be 100 dollars. "We have been getting our prescriptions for 90 days because it is cheaper that way with our insurance and using the mail pharmacy." Requesting rx be changed to dispense 90 days at a time. Evista 60mg  daily #90 1RF sent to [facility] on file. Patient is agreeable.  Routing to provider for final review. Patient agreeable to disposition. Patient aware MD will review message and nurse will return call with any additional instructions or change of disposition. Will close encounter.

## 2014-06-06 NOTE — Telephone Encounter (Signed)
Patient would like to speak with nurse about a prescription. 

## 2014-12-19 ENCOUNTER — Ambulatory Visit (INDEPENDENT_AMBULATORY_CARE_PROVIDER_SITE_OTHER): Payer: BLUE CROSS/BLUE SHIELD

## 2014-12-19 DIAGNOSIS — Z23 Encounter for immunization: Secondary | ICD-10-CM

## 2015-01-11 ENCOUNTER — Other Ambulatory Visit: Payer: Self-pay | Admitting: Obstetrics & Gynecology

## 2015-01-11 DIAGNOSIS — Z803 Family history of malignant neoplasm of breast: Secondary | ICD-10-CM

## 2015-01-11 MED ORDER — RALOXIFENE HCL 60 MG PO TABS: 60.0000 mg | ORAL_TABLET | Freq: Every day | ORAL | Status: DC

## 2015-01-11 NOTE — Telephone Encounter (Signed)
Medication refill request: Evista Last AEX:  05-30-14 Next AEX: 06-05-15 Last MMG (if hormonal medication request): 01-22-14 category c density,birads 2:neg Refill authorized: pt requesting 90 day supply. Please approve if appropriate.

## 2015-01-11 NOTE — Telephone Encounter (Signed)
Patient requesting refill of Evista to [facility] patient is requesting 90 day supply. Her member # is [id number] Best # to reach patient [phone]

## 2015-01-18 ENCOUNTER — Ambulatory Visit (INDEPENDENT_AMBULATORY_CARE_PROVIDER_SITE_OTHER): Payer: BLUE CROSS/BLUE SHIELD | Admitting: Family Medicine

## 2015-01-18 VITALS — BP 126/80 | HR 84 | Temp 98.4°F | Resp 16 | Ht 64.0 in | Wt 161.2 lb

## 2015-01-18 DIAGNOSIS — M791 Myalgia, unspecified site: Secondary | ICD-10-CM

## 2015-01-18 DIAGNOSIS — J029 Acute pharyngitis, unspecified: Secondary | ICD-10-CM

## 2015-01-18 DIAGNOSIS — B349 Viral infection, unspecified: Secondary | ICD-10-CM | POA: Diagnosis not present

## 2015-01-18 DIAGNOSIS — M545 Low back pain, unspecified: Secondary | ICD-10-CM

## 2015-01-18 DIAGNOSIS — M62830 Muscle spasm of back: Secondary | ICD-10-CM

## 2015-01-18 LAB — POCT RAPID STREP A (OFFICE): RAPID STREP A SCREEN: NEGATIVE

## 2015-01-18 MED ORDER — METHOCARBAMOL 500 MG PO TABS: 500.0000 mg | ORAL_TABLET | Freq: Four times a day (QID) | ORAL | Status: DC

## 2015-01-18 NOTE — Progress Notes (Signed)
Patient ID: Casey Conley, female    DOB: 04-May-1955  Age: 59 y.o. MRN: [id number]  Chief Complaint  Patient presents with  . Back Pain    right x 1 week  . Sore Throat  . Sinusitis  . chest congestion    pt states her chest feels heavy, but not pain    Subjective:   Patient is here complaining of a back pain has been hurting her over the right low back over last week.No specific injury. She also has a little sore throat and some headache congestion and a heaviness in her chest. She does not smoke. She's not been running a fever. The head and chest symptoms have been going for a couple of days. She wanted to be well for Christmas.    Current allergies, medications, problem list, past/family and social histories reviewed.  Objective:  BP 126/80 mmHg  Pulse 84  Temp(Src) 98.4 F (36.9 C) (Oral)  Resp 16  Ht 5\' 4"  (1.626 m)  Wt 161 lb 3.2 oz (73.12 kg)  BMI 27.66 kg/m2  SpO2 97%  LMP 09/02/2004  Pleasant lady, alert and oriented. No acute distress. Throat clear. Neck supple without nodes or thyromegaly. TMs normal. Chest clear to auscultation. Heart regular without murmurs. Abdomen soft. Is tender in the right low back only mildly, more pain when she flexes anteriorly extension lateral tilt and rotation does not seem to hurt a lot. Straight leg raising test negative though she has tight hamstrings.  Assessment & Plan:   Assessment: 1. Pharyngitis   2. Right-sided low back pain without sciatica   3. Myalgia   4. Muscle spasm of back   5. Viral illness       Plan: Results for orders placed or performed in visit on 01/18/15  POCT rapid strep A  Result Value Ref Range   Rapid Strep A Screen Negative Negative   treat URI symptomatically. Muscle relaxant for back. If she needs something stronger for pain she is to call me back.   Orders Placed This Encounter  Procedures  . POCT rapid strep A    Meds ordered this encounter  Medications  . methocarbamol (ROBAXIN) 500 MG  tablet    Sig: Take 1 tablet (500 mg total) by mouth 4 (four) times daily.    Dispense:  20 tablet    Refill:  0         Patient Instructions  Take Robaxin (methocarbamol) 1 pill morning, 1 afternoon, and 2 at bedtime for muscle relaxant  Continue the Aleve (naproxen) to twice daily  Consider chlorpheniramine for head congestion, which was in the old Contac  Use ice or heat or alternate  Contact me if not improving   Return as needed  Julus Kelley, MD 01/18/2015

## 2015-01-18 NOTE — Patient Instructions (Signed)
Take Robaxin (methocarbamol) 1 pill morning, 1 afternoon, and 2 at bedtime for muscle relaxant  Continue the Aleve (naproxen) to twice daily  Consider chlorpheniramine for head congestion, which was in the old Contac  Use ice or heat or alternate  Contact me if not improving

## 2015-06-05 ENCOUNTER — Other Ambulatory Visit: Payer: Self-pay | Admitting: Obstetrics & Gynecology

## 2015-06-05 ENCOUNTER — Ambulatory Visit: Payer: BLUE CROSS/BLUE SHIELD | Admitting: Certified Nurse Midwife

## 2015-06-05 NOTE — Telephone Encounter (Signed)
Patient needs Vitamin D check for next refill

## 2015-06-05 NOTE — Telephone Encounter (Signed)
Medication refill request: Vitamin D 50,000 iu's  Last AEX:  05/30/2014 with DL  Next AEX: 07/04/2015 with DL  Last MMG (if hormonal medication request): n/a Refill authorized: Please advise

## 2015-07-04 ENCOUNTER — Encounter: Payer: Self-pay | Admitting: Certified Nurse Midwife

## 2015-07-04 ENCOUNTER — Ambulatory Visit (INDEPENDENT_AMBULATORY_CARE_PROVIDER_SITE_OTHER): Payer: BLUE CROSS/BLUE SHIELD | Admitting: Certified Nurse Midwife

## 2015-07-04 VITALS — BP 112/62 | HR 76 | Resp 16 | Ht 64.75 in | Wt 156.6 lb

## 2015-07-04 DIAGNOSIS — Z124 Encounter for screening for malignant neoplasm of cervix: Secondary | ICD-10-CM | POA: Diagnosis not present

## 2015-07-04 DIAGNOSIS — Z0000 Encounter for general adult medical examination without abnormal findings: Secondary | ICD-10-CM

## 2015-07-04 DIAGNOSIS — N63 Unspecified lump in breast: Secondary | ICD-10-CM

## 2015-07-04 DIAGNOSIS — N6310 Unspecified lump in the right breast, unspecified quadrant: Secondary | ICD-10-CM

## 2015-07-04 DIAGNOSIS — Z01419 Encounter for gynecological examination (general) (routine) without abnormal findings: Secondary | ICD-10-CM | POA: Diagnosis not present

## 2015-07-04 DIAGNOSIS — N952 Postmenopausal atrophic vaginitis: Secondary | ICD-10-CM | POA: Diagnosis not present

## 2015-07-04 LAB — LIPID PANEL
CHOLESTEROL: 209 mg/dL — AB (ref 125–200)
HDL: 106 mg/dL (ref >=46)
LDL Cholesterol: 90 mg/dL (ref <130)
Total CHOL/HDL Ratio: 2 Ratio (ref <=5.0)
Triglycerides: 65 mg/dL (ref <150)
VLDL: 13 mg/dL (ref <30)

## 2015-07-04 LAB — CBC
HEMATOCRIT: 43.7 % (ref 35.0–45.0)
HEMOGLOBIN: 14.5 g/dL (ref 11.7–15.5)
MCH: 29 pg (ref 27.0–33.0)
MCHC: 33.2 g/dL (ref 32.0–36.0)
MCV: 87.4 fL (ref 80.0–100.0)
MPV: 9.5 fL (ref 7.5–12.5)
Platelets: 211 10*3/uL (ref 140–400)
RBC: 5 MIL/uL (ref 3.80–5.10)
RDW: 14 % (ref 11.0–15.0)
WBC: 4.9 10*3/uL (ref 3.8–10.8)

## 2015-07-04 LAB — COMPREHENSIVE METABOLIC PANEL
ALBUMIN: 4.4 g/dL (ref 3.6–5.1)
ALK PHOS: 108 U/L (ref 33–130)
ALT: 38 U/L — AB (ref 6–29)
AST: 28 U/L (ref 10–35)
BUN: 20 mg/dL (ref 7–25)
CO2: 25 mmol/L (ref 20–31)
CREATININE: 0.77 mg/dL (ref 0.50–1.05)
Calcium: 9.4 mg/dL (ref 8.6–10.4)
Chloride: 101 mmol/L (ref 98–110)
Glucose, Bld: 94 mg/dL (ref 65–99)
Potassium: 4.1 mmol/L (ref 3.5–5.3)
SODIUM: 138 mmol/L (ref 135–146)
TOTAL PROTEIN: 6.8 g/dL (ref 6.1–8.1)
Total Bilirubin: 0.5 mg/dL (ref 0.2–1.2)

## 2015-07-04 LAB — TSH: TSH: 3.73 m[IU]/L

## 2015-07-04 NOTE — Progress Notes (Signed)
Scheduled patient for right breast diagnostic mammogram with ultrasound at [facility] for today 07/04/2015 at 3 pm. Patient is agreeable to date and time.

## 2015-07-04 NOTE — Patient Instructions (Signed)
EXERCISE AND DIET:  We recommended that you start or continue a regular exercise program for good health. Regular exercise means any activity that makes your heart beat faster and makes you sweat.  We recommend exercising at least 30 minutes per day at least 3 days a week, preferably 4 or 5.  We also recommend a diet low in fat and sugar.  Inactivity, poor dietary choices and obesity can cause diabetes, heart attack, stroke, and kidney damage, among others.    ALCOHOL AND SMOKING:  Women should limit their alcohol intake to no more than 7 drinks/beers/glasses of wine (combined, not each!) per week. Moderation of alcohol intake to this level decreases your risk of breast cancer and liver damage. And of course, no recreational drugs are part of a healthy lifestyle.  And absolutely no smoking or even second hand smoke. Most people know smoking can cause heart and lung diseases, but did you know it also contributes to weakening of your bones? Aging of your skin?  Yellowing of your teeth and nails?  CALCIUM AND VITAMIN D:  Adequate intake of calcium and Vitamin D are recommended.  The recommendations for exact amounts of these supplements seem to change often, but generally speaking 600 mg of calcium (either carbonate or citrate) and 800 units of Vitamin D per day seems prudent. Certain women may benefit from higher intake of Vitamin D.  If you are among these women, your doctor will have told you during your visit.    PAP SMEARS:  Pap smears, to check for cervical cancer or precancers,  have traditionally been done yearly, although recent scientific advances have shown that most women can have pap smears less often.  However, every woman still should have a physical exam from her gynecologist every year. It will include a breast check, inspection of the vulva and vagina to check for abnormal growths or skin changes, a visual exam of the cervix, and then an exam to evaluate the size and shape of the uterus and  ovaries.  And after 60 years of age, a rectal exam is indicated to check for rectal cancers. We will also provide age appropriate advice regarding health maintenance, like when you should have certain vaccines, screening for sexually transmitted diseases, bone density testing, colonoscopy, mammograms, etc.   MAMMOGRAMS:  All women over 40 years old should have a yearly mammogram. Many facilities now offer a "3D" mammogram, which may cost around $50 extra out of pocket. If possible,  we recommend you accept the option to have the 3D mammogram performed.  It both reduces the number of women who will be called back for extra views which then turn out to be normal, and it is better than the routine mammogram at detecting truly abnormal areas.    COLONOSCOPY:  Colonoscopy to screen for colon cancer is recommended for all women at age 50.  We know, you hate the idea of the prep.  We agree, BUT, having colon cancer and not knowing it is worse!!  Colon cancer so often starts as a polyp that can be seen and removed at colonscopy, which can quite literally save your life!  And if your first colonoscopy is normal and you have no family history of colon cancer, most women don't have to have it again for 10 years.  Once every ten years, you can do something that may end up saving your life, right?  We will be happy to help you get it scheduled when you are ready.    Be sure to check your insurance coverage so you understand how much it will cost.  It may be covered as a preventative service at no cost, but you should check your particular policy.     Atrophic Vaginitis Atrophic vaginitis is when the tissues that line the vagina become dry and thin. This is caused by a drop in estrogen. Estrogen helps:  To keep the vagina moist.  To make a clear fluid that helps:  To lubricate the vagina for sex.  To protect the vagina from infection. If the lining of the vagina is dry and thin, it may:  Make sex painful. It may  also cause bleeding.  Cause a feeling of:  Burning.  Irritation.  Itchiness.  Make an exam of your vagina painful. It may also cause bleeding.  Make you lose interest in sex.  Cause a burning feeling when you pee.  Make your vaginal fluid (discharge) brown or yellow. For some women, there are no symptoms. This condition is most common in women who do not get their regular menstrual periods anymore (menopause). This often starts when a woman is 26-68 years old. HOME CARE  Take medicines only as told by your doctor. Do not use any herbal or alternative medicines unless your doctor says it is okay.  Use over-the-counter products for dryness only as told by your doctor. These include:  Creams.  Lubricants.  Moisturizers.  Do not douche.  Do not use products that can make your vagina dry. These include:  Scented feminine sprays.  Scented tampons.  Scented soaps.  If it hurts to have sex, tell your sexual partner. GET HELP IF:  Your discharge looks different than normal.  Your vagina has an unusual smell.  You have new symptoms.  Your symptoms do not get better with treatment.  Your symptoms get worse.   This information is not intended to replace advice given to you by your health care provider. Make sure you discuss any questions you have with your health care provider.   Document Released: 07/08/2007 Document Revised: 06/05/2014 Document Reviewed: 01/10/2014 Elsevier Interactive Patient Education [employer].

## 2015-07-04 NOTE — Progress Notes (Signed)
60 y.o. G0P0000 Married  Caucasian Fe here for annual exam. Menopausal no HRT. Denies vaginal bleeding or dryness.Not sexually active their choice.  Has PCP but does not visit regularly. Concerned about another friend who died from ovarian cancer. Very worried about this and considering another PUS for screening. Had 2 years ago with Dr. Sabra Heck. Does not do SBE. Desires screening labs today. No other health issues today. Continues with Evista use for Osteoporosis with last BMD showing osteopenia. .  Patient's last menstrual period was 09/02/2004.          Sexually active: No.  The current method of family planning is post menopausal status.    Exercising: No.  Home exercise routine includes walking 1 hrs per day. Smoker:  no  Health Maintenance: Pap:  03/21/13 WNL MMG:  02/05/15--BIRADS 1-neg Colonoscopy:  06/08/2011 BMD:   01/22/2014 Osteopenia-stable repeat 3 years TDaP:  2008 patient is sure of date Shingles: no Pneumonia: no Hep C and HIV: no Labs: will do today.   reports that she has never smoked. She has never used smokeless tobacco. She reports that she does not drink alcohol or use illicit drugs.  Past Medical History  Diagnosis Date  . ANEMIA-IRON DEFICIENCY 10/29/2008  . Dysuria 03/29/2009  . OSTEOPOROSIS 10/29/2008  . Osteoporosis     on Bisphosphonate for aprox 2007  . Fibroid, uterine   . History of fibrocystic disease of breast   . Proteinuria     24 hour-normal    Past Surgical History  Procedure Laterality Date  . Uterine fibroid embolization      s/p  . Breast lumpectomy  approx 1990    benign-left    Current Outpatient Prescriptions  Medication Sig Dispense Refill  . Calcium Carbonate-Vit D-Min (CALCIUM 1200 PO) Take by mouth.      . raloxifene (EVISTA) 60 MG tablet Take 1 tablet (60 mg total) by mouth daily. 90 tablet 1  . UNABLE TO FIND as needed. Herbal life vitamins    . Vitamin D, Ergocalciferol, (DRISDOL) 50000 units CAPS capsule TAKE 1 CAPSULE (50,000  UNITS TOTAL) BY MOUTH EVERY 14 (FOURTEEN) DAYS. 6 capsule 0   No current facility-administered medications for this visit.    Family History  Problem Relation Age of Onset  . Cancer Mother     Breast Cancer  . Arthritis Mother   . Dementia Mother   . Transient ischemic attack Mother   . Cancer Father     Lung Cancer, Colon Cancer  . Diabetes Father   . Hyperlipidemia Father   . Heart disease Father     CAD/CABG  . Diabetes Paternal Grandfather   . Cancer Maternal Grandmother     cecum cancer  . Cancer Paternal Grandmother     "female"  . Heart disease Paternal Grandfather   . Osteoporosis Mother   . Colon cancer Maternal Uncle     ROS:  Pertinent items are noted in HPI.  Otherwise, a comprehensive ROS was negative.  Exam:   BP 112/62 mmHg  Pulse 76  Resp 16  Ht 5' 4.75" (1.645 m)  Wt 156 lb 9.6 oz (71.033 kg)  BMI 26.25 kg/m2  LMP 09/02/2004 Height: 5' 4.75" (164.5 cm) Ht Readings from Last 3 Encounters:  07/04/15 5' 4.75" (1.645 m)  01/18/15 5\' 4"  (1.626 m)  05/30/14 5' 4.75" (1.645 m)    General appearance: alert, cooperative and appears stated age Head: Normocephalic, without obvious abnormality, atraumatic Neck: no adenopathy, supple, symmetrical, trachea midline and  thyroid normal to inspection and palpation Lungs: clear to auscultation bilaterally Breasts: normal appearance, no masses or tenderness, No nipple retraction or dimpling, No nipple discharge or bleeding, No axillary or supraclavicular adenopathy, positive findings: right breast at 3 o'clock 1 FB from aerola edge BB size mass, non tender, , has previous scar slightly near area from previous lumpectomy Heart: regular rate and rhythm Abdomen: soft, non-tender; no masses,  no organomegaly Extremities: extremities normal, atraumatic, no cyanosis or edema Skin: Skin color, texture, turgor normal. No rashes or lesions Lymph nodes: Cervical, supraclavicular, and axillary nodes normal. No abnormal  inguinal nodes palpated Neurologic: Grossly normal   Pelvic: External genitalia:  no lesions              Urethra:  normal appearing urethra with no masses, tenderness or lesions              Bartholin's and Skene's: normal                 Vagina: atrophic appearing vagina with normal color andscant moisture, no lesions              Cervix: normal,non tender,no lesions              Pap taken: Yes.   Bimanual Exam:  Uterus:  normal size, contour, position, consistency, mobility, non-tender              Adnexa: normal adnexa and no mass, fullness, tenderness               Rectovaginal: Confirms               Anus:  normal sphincter tone, no lesions  Chaperone present: yes  A:  Well Woman with normal exam  Menopausal no HRT  Right breast mass  Osteoporosis on Evista last BMD showed Osteopenia  Screening labs  Atrophic vaginitis   P:   Reviewed health and wellness pertinent to exam  Aware of need to advise if vaginal bleeding  Discussed breast finding and need for evaluation, patient agreeable. Patient will be scheduled for diagnostic mammogram and [place] prior to leaving. Questions addressed.  BMD due in 2018, Patient would like to continue Evista. Discussed risks/benefits and warning signs with use. Voiced understanding. Still has Rx will call when refill needed.  Labs:CBC, CMP, Hep C Lipid panel, TSH, Vitamin D  Discussed finding and etiology. Discussed estrogen use and does not want to use. Will do trial of coconut oil, instructions given for use. Questions addressed.  Pap smear as above taken with HPV reflex   counseled on breast self exam, mammography screening, menopause, adequate intake of calcium and vitamin D, diet and exercise  return annually or prn  An After Visit Summary was printed and given to the patient.

## 2015-07-05 LAB — VITAMIN D 25 HYDROXY (VIT D DEFICIENCY, FRACTURES): VIT D 25 HYDROXY: 26 ng/mL — AB (ref 30–100)

## 2015-07-05 LAB — HEPATITIS C ANTIBODY: HCV Ab: NEGATIVE

## 2015-07-05 LAB — IPS PAP TEST WITH REFLEX TO HPV

## 2015-07-05 NOTE — Progress Notes (Signed)
Reviewed personally.  M. Suzanne Tika Hannis, MD.  

## 2015-07-08 ENCOUNTER — Other Ambulatory Visit: Payer: Self-pay | Admitting: Certified Nurse Midwife

## 2015-07-08 DIAGNOSIS — R899 Unspecified abnormal finding in specimens from other organs, systems and tissues: Secondary | ICD-10-CM

## 2015-07-09 ENCOUNTER — Telehealth: Payer: Self-pay | Admitting: Emergency Medicine

## 2015-07-09 NOTE — Telephone Encounter (Signed)
Incoming call from patient. She received her lab results this morning with elevated ALT. She is calling because she would like to let Melvia Heaps CNM know that she has a [employer]" procedure on 06/04/15. She was informed that with this procedure the fat cells would shrink and be filtered out through the liver. She is wondering if this may have anything to do with elevated ALT number.  Patient is scheduled for follow up lab 07/22/15 and for next cool sculpting procedure on 07/25/15.  Advised patient that she can have lab drawn and if elevated with next draw will need to discuss her concerns with MD who is performing procedure.  She is agreeable. She does not want a call back because she is going out of the [place] but just wanted Debbi to be aware that she had the procedures. Patient aware to avoid Alcohol, NSAIDS and Tylenol until recheck.  Routing to provider and will close encounter as patient does not want  Return call until she is back in town after 07/17/15 and repeat labs are drawn.

## 2015-07-22 ENCOUNTER — Other Ambulatory Visit (INDEPENDENT_AMBULATORY_CARE_PROVIDER_SITE_OTHER): Payer: BLUE CROSS/BLUE SHIELD

## 2015-07-22 ENCOUNTER — Telehealth: Payer: Self-pay

## 2015-07-22 DIAGNOSIS — R899 Unspecified abnormal finding in specimens from other organs, systems and tissues: Secondary | ICD-10-CM

## 2015-07-22 LAB — ALT: ALT: 32 U/L — AB (ref 6–29)

## 2015-07-22 NOTE — Telephone Encounter (Signed)
Spoke with patient while in the office today for her lab work. Patient is requesting a refill for Raloxifene tablet 60 mg take 1 po daily. Requesting a refill to [facility] for a 90 day supply with refills. Requesting this be sent with a message to place on hold as she just picked up a 90 day supply, but wanted to ensure we had enough time to send in a refill. Last aex was 07/04/2015.   Casey Conley CNM okay to refill Raloxifene at this time?

## 2015-07-23 ENCOUNTER — Other Ambulatory Visit: Payer: Self-pay | Admitting: Certified Nurse Midwife

## 2015-07-23 DIAGNOSIS — R899 Unspecified abnormal finding in specimens from other organs, systems and tissues: Secondary | ICD-10-CM

## 2015-07-23 NOTE — Telephone Encounter (Signed)
Notes Recorded by Regina Eck, CNM on 07/23/2015 at 7:38 AM Notify patient that her ALT is still elevated but coming down from 38 to 32. Normal less than 29 Recheck in one month please schedule order placed

## 2015-07-23 NOTE — Telephone Encounter (Signed)
Patient notified of lab results as instructed by French Ana, CNM. Patient states she has avoided all motrin products and drinks very little alcohol anyway. She is agreeable to repeat lab in one month but would like to know if she can go back to taking Turmeric, OTC supplement that is anti-inflammatory. She also has one more "cool-sculpting" session scheduled for this Thursday which she thinks may be working her liver harder.  Patient was interested in adding milk thistle as well to help improve liver function. Advised not to add anything else till we see what repeat level is on its own. Patient would like to know if Debbi thinks she can restart Turmeric before the repeat blood test. Can leave message on voice mail.  Lab appointment 08-26-15.

## 2015-07-23 NOTE — Telephone Encounter (Signed)
Call to patient with lab results. Left message to call back for triage nurse.

## 2015-07-24 NOTE — Telephone Encounter (Signed)
At return call to patient, please advise of need for recheck of R Breast. R Breast Diagnostic imaging completed at [facility] 07/04/15 and Melvia Heaps CNM would like to recheck area that was palpable on annual exam.

## 2015-07-24 NOTE — Telephone Encounter (Signed)
Call to patient. Notified of instructions from Debbi to avoid Tumeric until repeat labs. Also scheduled breast recheck for 08-28-15. Will repeat labwork at same appointment.   Encounter closed.

## 2015-07-24 NOTE — Telephone Encounter (Signed)
Would prefer she not take Tumeric, because anti inflammatory supplement can affect the liver also

## 2015-07-30 ENCOUNTER — Encounter: Payer: Self-pay | Admitting: Certified Nurse Midwife

## 2015-08-08 ENCOUNTER — Ambulatory Visit: Payer: BLUE CROSS/BLUE SHIELD | Admitting: Certified Nurse Midwife

## 2015-08-26 ENCOUNTER — Other Ambulatory Visit: Payer: BLUE CROSS/BLUE SHIELD

## 2015-08-28 ENCOUNTER — Encounter: Payer: Self-pay | Admitting: Certified Nurse Midwife

## 2015-08-28 ENCOUNTER — Ambulatory Visit (INDEPENDENT_AMBULATORY_CARE_PROVIDER_SITE_OTHER): Payer: BLUE CROSS/BLUE SHIELD | Admitting: Certified Nurse Midwife

## 2015-08-28 VITALS — BP 110/68 | HR 68 | Resp 16 | Ht 64.75 in | Wt 158.0 lb

## 2015-08-28 DIAGNOSIS — Z1239 Encounter for other screening for malignant neoplasm of breast: Secondary | ICD-10-CM

## 2015-08-28 DIAGNOSIS — R899 Unspecified abnormal finding in specimens from other organs, systems and tissues: Secondary | ICD-10-CM | POA: Diagnosis not present

## 2015-08-28 NOTE — Patient Instructions (Signed)
Breast Self-Awareness Breast self-awareness allows you to notice a breast problem early while it is still small. Do a breast self-exam:  Every month, 5-7 days after your period (menstrual period).  At the same time each month if you do not have periods anymore. Look for any:  Difference between your breasts (size, shape, or position).  Change in breast shape or size.  Fluid or blood coming from your nipples.  Changes in your nipples (dimpling, nipple movement).   Change in skin color or texture (redness, scaly areas). Feel for:  Lumps.  Bumps.  Dips.  Any other changes. HOW TO DO A BREAST SELF-EXAM Look at your breasts and nipples. 1. Take off all your clothes above your waist. 2. Stand in front of a mirror in a room with good lighting. 3. Put your hands on your hips and push your hands downward. Feel your breasts.  1. Lie flat on your back or stand in the shower or tub. If you are in the shower or tub, have wet, soapy hands. 2. Place your right arm above your head. 3. Place your left hand in the right underarm area. 4. Make small circles using the pads (not the fingertips) of your 3 middle fingers. Press lightly and then with medium and firm pressure. 5. Move your fingers a little lower and make the small circles at the 3 pressures (light, medium, and firm). 6. Continue moving your fingers lower and making circles until you reach the bottom of your breast. 7. Move your fingers one finger-width towards the center of the body. 8. Continue making the circles, this time moving upward until you reach the bottom of your neck. 9. Move your fingers one finger-width towards the center of your body. 10. Make circles downward when starting at the bottom of the neck. Make circles upward when starting at the bottom of the breast. Stop when you reach the middle of the chest. 11.  Repeat these steps on the other breast. Write down what looks and feels normal for each breast. Also write  down any changes you notice. GET HELP RIGHT AWAY IF:  You see any changes in your breasts or nipples.  You see skin changes.  You have unusual discharge from your nipples.  You feel a new lump.  You feel unusually thick areas.   This information is not intended to replace advice given to you by your health care provider. Make sure you discuss any questions you have with your health care provider.   Document Released: 07/08/2007 Document Revised: 01/06/2012 Document Reviewed: 05/06/2011 Elsevier Interactive Patient Education [employer].

## 2015-08-28 NOTE — Progress Notes (Signed)
    Subjective:   60 y.o. MarriedCaucasian female presents for  Follow up evaluation of right breast mass. Area was noted at aex 07/04/15. History of previous surgery in that breast near area. ? Scar tissue or fibroglandular or mass was discussed with patient at that time. Patient does SBE and did not note any change. Diagnostic 3 D mammogram was done and [place]. [place] was negative for any concerns and thought to be fibrous origin and no abnormalities noted. Mammogram was done and compared to other mammograms with no changes noted.  Recommendations of repeat mammogram and one year.   ALso here for repeat ALT due to elevation. Stopped tumeric. Review of Systems Pertinent items are noted in HPI.@SUBJECTIVE    Objective:   General appearance: alert, cooperative, appears stated age and no distress Breasts: normal appearance, no masses or tenderness, No nipple retraction or dimpling, No nipple discharge or bleeding, No axillary or supraclavicular adenopathy, right breast at 3 o'clock area bb area not palpated,but fibrous area still palpated no change and feels mobile is just distal to previous scar, soft and fibrous feel. No change.  Patient is still not able to feel any change.   Assessment:   ASSESSMENT:Patient is diagnosed with right breast fibrous change ? Related to previous scarring. No change in breast exam. Mammogram and [place] benign finding.  Elevated ALT decreasing, recheck today. Plan:   Discussed finding with patient and discussed no change in finding and negative imaging. Discussed recheck in office 3 months if no change with SBE. Patient agreeable. Lab; ALT  Rv prn

## 2015-08-29 LAB — HEPATIC FUNCTION PANEL
ALK PHOS: 91 U/L (ref 33–130)
ALT: 17 U/L (ref 6–29)
AST: 19 U/L (ref 10–35)
Albumin: 4.4 g/dL (ref 3.6–5.1)
BILIRUBIN DIRECT: 0.1 mg/dL (ref <=0.2)
BILIRUBIN INDIRECT: 0.3 mg/dL (ref 0.2–1.2)
BILIRUBIN TOTAL: 0.4 mg/dL (ref 0.2–1.2)
Total Protein: 6.6 g/dL (ref 6.1–8.1)

## 2015-08-29 NOTE — Progress Notes (Signed)
Encounter reviewed Gabrial Poppell, MD   

## 2015-11-20 ENCOUNTER — Telehealth: Payer: Self-pay | Admitting: Certified Nurse Midwife

## 2015-11-20 ENCOUNTER — Other Ambulatory Visit: Payer: Self-pay | Admitting: Certified Nurse Midwife

## 2015-11-20 DIAGNOSIS — Z803 Family history of malignant neoplasm of breast: Secondary | ICD-10-CM

## 2015-11-20 DIAGNOSIS — M8589 Other specified disorders of bone density and structure, multiple sites: Secondary | ICD-10-CM

## 2015-11-20 MED ORDER — RALOXIFENE HCL 60 MG PO TABS: 60.0000 mg | ORAL_TABLET | Freq: Every day | ORAL | 1 refills | Status: DC

## 2015-11-20 NOTE — Telephone Encounter (Signed)
Order sent.

## 2015-11-20 NOTE — Telephone Encounter (Signed)
Medication refill request: Evista  Last AEX:  07-04-15  Next AEX: 07-08-16 Last MMG (if hormonal medication request): 07-04-15 Breast U/S WNL  Refill authorized: please advise

## 2015-11-20 NOTE — Telephone Encounter (Signed)
Patient is asking for refills of raloxifene. [facility] mail order pharmacy.

## 2015-11-27 ENCOUNTER — Ambulatory Visit (INDEPENDENT_AMBULATORY_CARE_PROVIDER_SITE_OTHER): Payer: BLUE CROSS/BLUE SHIELD | Admitting: Certified Nurse Midwife

## 2015-11-27 ENCOUNTER — Encounter: Payer: Self-pay | Admitting: Certified Nurse Midwife

## 2015-11-27 VITALS — BP 114/66 | HR 72 | Resp 16 | Ht 64.75 in | Wt 161.0 lb

## 2015-11-27 DIAGNOSIS — M8589 Other specified disorders of bone density and structure, multiple sites: Secondary | ICD-10-CM

## 2015-11-27 DIAGNOSIS — Z1231 Encounter for screening mammogram for malignant neoplasm of breast: Secondary | ICD-10-CM | POA: Diagnosis not present

## 2015-11-27 DIAGNOSIS — Z1239 Encounter for other screening for malignant neoplasm of breast: Secondary | ICD-10-CM

## 2015-11-27 MED ORDER — RALOXIFENE HCL 60 MG PO TABS: 60.0000 mg | ORAL_TABLET | Freq: Every day | ORAL | 1 refills | Status: DC

## 2015-11-27 NOTE — Patient Instructions (Signed)
Breast Self-Awareness Practicing breast self-awareness may pick up problems early, prevent significant medical complications, and possibly save your life. By practicing breast self-awareness, you can become familiar with how your breasts look and feel and if your breasts are changing. This allows you to notice changes early. It can also offer you some reassurance that your breast health is good. One way to learn what is normal for your breasts and whether your breasts are changing is to do a breast self-exam. If you find a lump or something that was not present in the past, it is best to contact your caregiver right away. Other findings that should be evaluated by your caregiver include nipple discharge, especially if it is bloody; skin changes or reddening; areas where the skin seems to be pulled in (retracted); or new lumps and bumps. Breast pain is seldom associated with cancer (malignancy), but should also be evaluated by a caregiver. HOW TO PERFORM A BREAST SELF-EXAM The best time to examine your breasts is 5-7 days after your menstrual period is over. During menstruation, the breasts are lumpier, and it may be more difficult to pick up changes. If you do not menstruate, have reached menopause, or had your uterus removed (hysterectomy), you should examine your breasts at regular intervals, such as monthly. If you are breastfeeding, examine your breasts after a feeding or after using a breast pump. Breast implants do not decrease the risk for lumps or tumors, so continue to perform breast self-exams as recommended. Talk to your caregiver about how to determine the difference between the implant and breast tissue. Also, talk about the amount of pressure you should use during the exam. Over time, you will become more familiar with the variations of your breasts and more comfortable with the exam. A breast self-exam requires you to remove all your clothes above the waist. 1. Look at your breasts and nipples.  Stand in front of a mirror in a room with good lighting. With your hands on your hips, push your hands firmly downward. Look for a difference in shape, contour, and size from one breast to the other (asymmetry). Asymmetry includes puckers, dips, or bumps. Also, look for skin changes, such as reddened or scaly areas on the breasts. Look for nipple changes, such as discharge, dimpling, repositioning, or redness. 2. Carefully feel your breasts. This is best done either in the shower or tub while using soapy water or when flat on your back. Place the arm (on the side of the breast you are examining) above your head. Use the pads (not the fingertips) of your three middle fingers on your opposite hand to feel your breasts. Start in the underarm area and use  inch (2 cm) overlapping circles to feel your breast. Use 3 different levels of pressure (light, medium, and firm pressure) at each circle before moving to the next circle. The light pressure is needed to feel the tissue closest to the skin. The medium pressure will help to feel breast tissue a little deeper, while the firm pressure is needed to feel the tissue close to the ribs. Continue the overlapping circles, moving downward over the breast until you feel your ribs below your breast. Then, move one finger-width towards the center of the body. Continue to use the  inch (2 cm) overlapping circles to feel your breast as you move slowly up toward the collar bone (clavicle) near the base of the neck. Continue the up and down exam using all 3 pressures until you reach the   middle of the chest. Do this with each breast, carefully feeling for lumps or changes. 3.  Keep a written record with breast changes or normal findings for each breast. By writing this information down, you do not need to depend only on memory for size, tenderness, or location. Write down where you are in your menstrual cycle, if you are still menstruating. Breast tissue can have some lumps or  thick tissue. However, see your caregiver if you find anything that concerns you.  SEEK MEDICAL CARE IF:  You see a change in shape, contour, or size of your breasts or nipples.   You see skin changes, such as reddened or scaly areas on the breasts or nipples.   You have an unusual discharge from your nipples.   You feel a new lump or unusually thick areas.    This information is not intended to replace advice given to you by your health care provider. Make sure you discuss any questions you have with your health care provider.   Document Released: 01/19/2005 Document Revised: 01/06/2012 Document Reviewed: 05/06/2011 Elsevier Interactive Patient Education 2016 Elsevier Inc.  

## 2015-11-27 NOTE — Progress Notes (Signed)
   Subjective:   60 y.o. Married Caucasian female presents for follow up of breast mass noted in right breast thought to be fibroglandular or scar tissue from previous surgery on breast. Patient had normal diagnostic mm and negative [place]. Area thought to of fibrous origin and no abnormalities noted. Patient was concerned she does not do SBE and we decided follow up  In 3 months with clinical exam appropriate. She has not noted any changes in either breast when showering. No health issues. Needs written RX for Evista due to Mail order Pharmacy does not have it in stock, will need to feel locally.  Review of Systems Pertinent items are noted in HPI.   Objective:   General appearance: alert, cooperative, appears stated age and no distress Breasts: normal appearance, no masses or tenderness, No nipple retraction or dimpling, No nipple discharge or bleeding, No axillary or supraclavicular adenopathy, bilateral and no mass noted in right breast, scar tissue non tender, from  previous surgery only.   Assessment:   ASSESSMENT:Normal breast exam Osteopenia on Evista needs Rx for local pharmacy Plan:   PLAN: Encouraged SBE and keep next annual mammogram appointment as scheduled. Come in if any changes. Rx Evista given to patient    Rv prn

## 2015-11-29 NOTE — Progress Notes (Signed)
Encounter reviewed Jill Jertson, MD   

## 2015-12-17 DIAGNOSIS — R635 Abnormal weight gain: Secondary | ICD-10-CM | POA: Diagnosis not present

## 2015-12-17 DIAGNOSIS — N951 Menopausal and female climacteric states: Secondary | ICD-10-CM | POA: Diagnosis not present

## 2015-12-17 DIAGNOSIS — M79671 Pain in right foot: Secondary | ICD-10-CM | POA: Diagnosis not present

## 2015-12-17 DIAGNOSIS — M7741 Metatarsalgia, right foot: Secondary | ICD-10-CM | POA: Diagnosis not present

## 2015-12-24 DIAGNOSIS — E782 Mixed hyperlipidemia: Secondary | ICD-10-CM | POA: Diagnosis not present

## 2015-12-24 DIAGNOSIS — E559 Vitamin D deficiency, unspecified: Secondary | ICD-10-CM | POA: Diagnosis not present

## 2015-12-24 DIAGNOSIS — R7303 Prediabetes: Secondary | ICD-10-CM | POA: Diagnosis not present

## 2015-12-24 DIAGNOSIS — E663 Overweight: Secondary | ICD-10-CM | POA: Diagnosis not present

## 2015-12-24 DIAGNOSIS — N951 Menopausal and female climacteric states: Secondary | ICD-10-CM | POA: Diagnosis not present

## 2015-12-24 DIAGNOSIS — E538 Deficiency of other specified B group vitamins: Secondary | ICD-10-CM | POA: Diagnosis not present

## 2016-01-01 DIAGNOSIS — R7303 Prediabetes: Secondary | ICD-10-CM | POA: Diagnosis not present

## 2016-01-01 DIAGNOSIS — E663 Overweight: Secondary | ICD-10-CM | POA: Diagnosis not present

## 2016-01-01 DIAGNOSIS — E538 Deficiency of other specified B group vitamins: Secondary | ICD-10-CM | POA: Diagnosis not present

## 2016-01-01 DIAGNOSIS — E782 Mixed hyperlipidemia: Secondary | ICD-10-CM | POA: Diagnosis not present

## 2016-01-08 DIAGNOSIS — R7303 Prediabetes: Secondary | ICD-10-CM | POA: Diagnosis not present

## 2016-01-08 DIAGNOSIS — E782 Mixed hyperlipidemia: Secondary | ICD-10-CM | POA: Diagnosis not present

## 2016-01-08 DIAGNOSIS — E538 Deficiency of other specified B group vitamins: Secondary | ICD-10-CM | POA: Diagnosis not present

## 2016-01-08 DIAGNOSIS — E663 Overweight: Secondary | ICD-10-CM | POA: Diagnosis not present

## 2016-01-22 DIAGNOSIS — E782 Mixed hyperlipidemia: Secondary | ICD-10-CM | POA: Diagnosis not present

## 2016-01-22 DIAGNOSIS — E663 Overweight: Secondary | ICD-10-CM | POA: Diagnosis not present

## 2016-01-22 DIAGNOSIS — E538 Deficiency of other specified B group vitamins: Secondary | ICD-10-CM | POA: Diagnosis not present

## 2016-01-22 DIAGNOSIS — R7303 Prediabetes: Secondary | ICD-10-CM | POA: Diagnosis not present

## 2016-02-06 DIAGNOSIS — M810 Age-related osteoporosis without current pathological fracture: Secondary | ICD-10-CM | POA: Diagnosis not present

## 2016-02-06 DIAGNOSIS — Z1231 Encounter for screening mammogram for malignant neoplasm of breast: Secondary | ICD-10-CM | POA: Diagnosis not present

## 2016-02-06 DIAGNOSIS — Z803 Family history of malignant neoplasm of breast: Secondary | ICD-10-CM | POA: Diagnosis not present

## 2016-02-13 ENCOUNTER — Encounter: Payer: Self-pay | Admitting: Obstetrics & Gynecology

## 2016-02-17 ENCOUNTER — Telehealth: Payer: Self-pay | Admitting: *Deleted

## 2016-02-17 NOTE — Telephone Encounter (Signed)
Patient notified of BMD results. Patient verbalized understanding. Patient agreeable to schedule consult. Consult scheduled for Monday 02/24/16 at 1500. Patient agreeable to date and time of appointment. BMD report to front for scan.    Routing to provider for final review. Patient agreeable to disposition. Will close encounter.

## 2016-02-24 ENCOUNTER — Encounter: Payer: Self-pay | Admitting: Obstetrics & Gynecology

## 2016-02-24 ENCOUNTER — Ambulatory Visit (INDEPENDENT_AMBULATORY_CARE_PROVIDER_SITE_OTHER): Payer: BLUE CROSS/BLUE SHIELD | Admitting: Obstetrics & Gynecology

## 2016-02-24 VITALS — BP 116/70 | HR 74 | Resp 14 | Ht 65.5 in | Wt 160.0 lb

## 2016-02-24 DIAGNOSIS — M810 Age-related osteoporosis without current pathological fracture: Secondary | ICD-10-CM

## 2016-02-24 LAB — PHOSPHORUS: PHOSPHORUS: 4 mg/dL (ref 2.5–4.5)

## 2016-02-24 LAB — CALCIUM: CALCIUM: 9.9 mg/dL (ref 8.6–10.4)

## 2016-02-24 NOTE — Progress Notes (Signed)
Subjective:    35 yrs Married Caucasian G0P0000  female here to discuss recent BMD obtained 12/17 showing osteoporosis in her forearm (which I do not have a comparison for at this time) with T score -3.7 and in her spine with T score of -3.3.  She also has worsening oseopenia in both hips with T score of -2.4 noted.  Reviewed findings with patient and comparisons to other BMDs made.  She is currently on Evista and has been on Fosamax in the past.  She has been tested for secondary causes of osteoporosis.  Her TSh, Vit D, PTH, metabolic panel, CBC have been normal.     Osteoporosis Risk Factors  Nonmodifiable Personal Hx of fracture as an adult: yes Hx of fracture in first-degree relative: yes - mother Caucasian race: yes Advanced age: no Female sex: yes Dementia: no Poor health/frailty: no  Potentially modifiable: Tobacco use: no Low body weight (<127 lbs): no Estrogen deficiency  early menopause (age <45) or bilateral ovariectomy: no  prolonged premenopausal amenorrhea (>1 yr): no Low calcium intake (lifelong): no Alcohol use more than 2 drinks per day: no Recurrent falls: no Inadequate physical activity: no  Current calcium and Vit D intake:  Review of Systems A comprehensive review of systems was negative.     Objective:   PHYSICAL EXAM BP 116/70 (BP Location: Right Arm, Patient Position: Sitting, Cuff Size: Normal)   Pulse 74   Resp 14   Ht 5' 5.5" (1.664 m)   Wt 160 lb (72.6 kg)   LMP 09/02/2004   BMI 26.22 kg/m  General appearance: alert, cooperative and no distress  Imaging Bone Density: Spine T Score: -3.3, Hip T Score: -2.4   FRAX not calculated due to presence of osteoporosis and h/o treatment  Discussion:  As Evista does not seem to be doing anything to help bone loss, recommend considering change in therapy.  As this point, I would recommend she consider Reclast or Prolia.  Treatment, dosing, side effects including osteonecrosis of the jaw and increased  risks of infections reviewed with pt.  She is going to do some blood work today and wants to do some research about medications before deciding what she wants to do                         Assessment:   Osteoporosis with T score -3.7 in forearm   Plan:   1.  Phosphorus and calcium level will be obtained today 2.  Pt will do some research about Prolia and Reclast 3.  Continue supplemental calcium and Vit D. 4.  Recommended considering a trainer to do some strength training, balance work, and target exercises for bone improvement 5.  Repeat bone density in 2 years.  ~30 minutes spent with patient >50% of time was in face to face discussion of above.

## 2016-02-25 ENCOUNTER — Telehealth: Payer: Self-pay | Admitting: Obstetrics & Gynecology

## 2016-02-25 NOTE — Telephone Encounter (Signed)
Spoke with patient. Patient states she spoke with Dr. Sabra Heck 1/22 about therapy options for osteoporosis. Patient states she does not wish to move forward with Prolia at this time just yet. Patient states she is still researching options and comparing Reclast. Patient states she does not like what the Prolia does to cells to make bone stronger. Patient states she would like to see how blood work comes back and maybe that will make a decision for her if she is not a candidate for prolia. Advised patient will update Dr. Sabra Heck and for patient to return call with questions/concerns. Patient verbalizes understanding and is agreeable.  Cc: Theresia Lo  Routing to provider for final review. Patient is agreeable to disposition. Will close encounter.

## 2016-02-25 NOTE — Telephone Encounter (Signed)
Left message to call Quindell Shere at [phone].  

## 2016-02-25 NOTE — Telephone Encounter (Signed)
Patient was seen yesterday and has decided she does not want to commit to the Prolia at this time.

## 2016-02-25 NOTE — Telephone Encounter (Signed)
Return call to Jill. °

## 2016-03-24 DIAGNOSIS — M810 Age-related osteoporosis without current pathological fracture: Secondary | ICD-10-CM | POA: Diagnosis not present

## 2016-03-25 DIAGNOSIS — M810 Age-related osteoporosis without current pathological fracture: Secondary | ICD-10-CM | POA: Diagnosis not present

## 2016-06-08 DIAGNOSIS — D124 Benign neoplasm of descending colon: Secondary | ICD-10-CM | POA: Diagnosis not present

## 2016-06-08 DIAGNOSIS — Z800 Family history of malignant neoplasm of digestive organs: Secondary | ICD-10-CM | POA: Diagnosis not present

## 2016-06-08 DIAGNOSIS — Z1211 Encounter for screening for malignant neoplasm of colon: Secondary | ICD-10-CM | POA: Diagnosis not present

## 2016-06-09 DIAGNOSIS — D126 Benign neoplasm of colon, unspecified: Secondary | ICD-10-CM | POA: Insufficient documentation

## 2016-06-30 DIAGNOSIS — Z7983 Long term (current) use of bisphosphonates: Secondary | ICD-10-CM | POA: Diagnosis not present

## 2016-06-30 DIAGNOSIS — M810 Age-related osteoporosis without current pathological fracture: Secondary | ICD-10-CM | POA: Diagnosis not present

## 2016-07-08 ENCOUNTER — Ambulatory Visit: Payer: BLUE CROSS/BLUE SHIELD | Admitting: Certified Nurse Midwife

## 2016-07-31 ENCOUNTER — Ambulatory Visit: Payer: BLUE CROSS/BLUE SHIELD | Admitting: Certified Nurse Midwife

## 2016-10-06 ENCOUNTER — Encounter: Payer: Self-pay | Admitting: Certified Nurse Midwife

## 2016-10-06 ENCOUNTER — Ambulatory Visit (INDEPENDENT_AMBULATORY_CARE_PROVIDER_SITE_OTHER): Payer: BLUE CROSS/BLUE SHIELD | Admitting: Certified Nurse Midwife

## 2016-10-06 VITALS — BP 110/76 | HR 68 | Resp 16 | Ht 64.75 in | Wt 160.0 lb

## 2016-10-06 DIAGNOSIS — Z01419 Encounter for gynecological examination (general) (routine) without abnormal findings: Secondary | ICD-10-CM

## 2016-10-06 NOTE — Progress Notes (Signed)
61 y.o. G0P0000 Married  Caucasian Fe here for annual exam. Menopausal no HRT. No vaginal bleeding or vaginal dryness. Some periodic stress incontinence with prolonged holding urine. No other health issues. Being seen by Osteoporosis  Clinic at [facility] and now on [facility]. Doing well. Feels this is a good choice. Sees Dr. Jenny Reichmann yearly for aex, labs if needed. Had many labs at [facility] and declines any today. Colonoscopy done and had one polyp. Will repeat in 5 years. No other health issues today.  Patient's last menstrual period was 09/02/2004.          Sexually active: No.  The current method of family planning is post menopausal status.    Exercising: Yes.    walking Smoker:  no  Health Maintenance: Pap:  03-21-13 neg, 07-04-15 neg History of Abnormal Pap: no MMG:  02-06-16 category c density birads 1:neg Self Breast exams: no Colonoscopy:  2018 f/u 70yrs, one polyp BMD:   2018 osteoporosis TDaP:  2008, pt declines update Shingles: no Pneumonia: no Hep C and HIV: Hep c neg 2017 Labs: yes   reports that she has never smoked. She has never used smokeless tobacco. She reports that she does not drink alcohol or use drugs.  Past Medical History:  Diagnosis Date  . ANEMIA-IRON DEFICIENCY 10/29/2008  . Dysuria 03/29/2009  . Fibroid, uterine   . History of fibrocystic disease of breast   . OSTEOPOROSIS 10/29/2008  . Osteoporosis    on Bisphosphonate for aprox 2007  . Proteinuria    24 hour-normal    Past Surgical History:  Procedure Laterality Date  . BREAST LUMPECTOMY  approx 1990   benign-left  . UTERINE FIBROID EMBOLIZATION     s/p    Current Outpatient Prescriptions  Medication Sig Dispense Refill  . Calcium Citrate-Vitamin D (CALCIUM + D PO) Take by mouth daily.    . Cholecalciferol (VITAMIN D PO) Take 5,000 Int'l Units by mouth daily.    . Doxylamine Succinate, Sleep, (SLEEP AID PO) Take by mouth at bedtime.    . Teriparatide, Recombinant, (FORTEO [place]) Inject into the skin.      No current facility-administered medications for this visit.     Family History  Problem Relation Age of Onset  . Cancer Mother        Breast Cancer  . Arthritis Mother   . Dementia Mother   . Transient ischemic attack Mother   . Cancer Father        Lung Cancer, Colon Cancer  . Diabetes Father   . Hyperlipidemia Father   . Heart disease Father        CAD/CABG  . Diabetes Paternal Grandfather   . Cancer Maternal Grandmother        cecum cancer  . Cancer Paternal Grandmother        "female"  . Heart disease Paternal Grandfather   . Osteoporosis Mother   . Colon cancer Maternal Uncle     ROS:  Pertinent items are noted in HPI.  Otherwise, a comprehensive ROS was negative.  Exam:   BP 110/76   Pulse 68   Resp 16   Ht 5' 4.75" (1.645 m)   Wt 160 lb (72.6 kg)   LMP 09/02/2004   BMI 26.83 kg/m  Height: 5' 4.75" (164.5 cm) Ht Readings from Last 3 Encounters:  10/06/16 5' 4.75" (1.645 m)  02/24/16 5' 5.5" (1.664 m)  11/27/15 5' 4.75" (1.645 m)    General appearance: alert, cooperative and appears stated age  Head: Normocephalic, without obvious abnormality, atraumatic Neck: no adenopathy, supple, symmetrical, trachea midline and thyroid normal to inspection and palpation Lungs: clear to auscultation bilaterally Breasts: normal appearance, no masses or tenderness, No nipple retraction or dimpling, No nipple discharge or bleeding, No axillary or supraclavicular adenopathy Heart: regular rate and rhythm Abdomen: soft, non-tender; no masses,  no organomegaly Extremities: extremities normal, atraumatic, no cyanosis or edema Skin: Skin color, texture, turgor normal. No rashes or lesions Lymph nodes: Cervical, supraclavicular, and axillary nodes normal. No abnormal inguinal nodes palpated Neurologic: Grossly normal   Pelvic: External genitalia:  no lesions              Urethra:  normal appearing urethra with no masses, tenderness or lesions              Bartholin's and  Skene's: normal                 Vagina: normal appearing vagina with normal color and discharge, no lesions              Cervix: anteverted, no cervical motion tenderness and no lesions              Pap taken: No. Bimanual Exam:  Uterus:  normal size, contour, position, consistency, mobility, non-tender              Adnexa: normal adnexa and no mass, fullness, tenderness               Rectovaginal: Confirms               Anus:  normal sphincter tone, no lesions  Chaperone present: yes  A:  Well Woman with normal exam  Menopausal no HRT  Osteoporosis on Reclast with Osteoporosis clinic at [facility] history of colon cancer( father)  Stress incontinence mild  P:   Reviewed health and wellness pertinent to exam  Aware of need to evaluate if vaginal bleeding  Continue follow up as indicated with Osteoporosis  Aware to notify if bowel movement changes  Discussed not holding urine for long periods of time and make sure she is drinking adequate water intake. Work on Kegel exercise and advise if issues.  Pap smear: no  counseled on breast self exam, mammography screening, feminine hygiene, adequate intake of calcium and vitamin D, diet and exercise, and kegel exercise  return annually or prn  An After Visit Summary was printed and given to the patient.

## 2016-10-06 NOTE — Patient Instructions (Signed)

## 2016-12-02 ENCOUNTER — Ambulatory Visit: Payer: BLUE CROSS/BLUE SHIELD | Admitting: Certified Nurse Midwife

## 2017-02-08 DIAGNOSIS — Z1231 Encounter for screening mammogram for malignant neoplasm of breast: Secondary | ICD-10-CM | POA: Diagnosis not present

## 2017-02-08 DIAGNOSIS — Z803 Family history of malignant neoplasm of breast: Secondary | ICD-10-CM | POA: Diagnosis not present

## 2017-02-16 ENCOUNTER — Encounter: Payer: Self-pay | Admitting: Internal Medicine

## 2017-02-16 ENCOUNTER — Ambulatory Visit (INDEPENDENT_AMBULATORY_CARE_PROVIDER_SITE_OTHER): Payer: BLUE CROSS/BLUE SHIELD | Admitting: Internal Medicine

## 2017-02-16 VITALS — BP 128/84 | HR 85 | Temp 97.9°F | Ht 64.75 in | Wt 164.0 lb

## 2017-02-16 DIAGNOSIS — Z23 Encounter for immunization: Secondary | ICD-10-CM | POA: Diagnosis not present

## 2017-02-16 DIAGNOSIS — Z114 Encounter for screening for human immunodeficiency virus [HIV]: Secondary | ICD-10-CM | POA: Diagnosis not present

## 2017-02-16 DIAGNOSIS — E559 Vitamin D deficiency, unspecified: Secondary | ICD-10-CM

## 2017-02-16 DIAGNOSIS — Z0000 Encounter for general adult medical examination without abnormal findings: Secondary | ICD-10-CM

## 2017-02-16 DIAGNOSIS — B370 Candidal stomatitis: Secondary | ICD-10-CM | POA: Diagnosis not present

## 2017-02-16 MED ORDER — FLUCONAZOLE 100 MG PO TABS: 100.0000 mg | ORAL_TABLET | Freq: Every day | ORAL | 0 refills | Status: DC

## 2017-02-16 MED ORDER — ZOSTER VAC RECOMB ADJUVANTED 50 MCG/0.5ML IM SUSR: 0.5000 mL | Freq: Once | INTRAMUSCULAR | 1 refills | Status: AC

## 2017-02-16 NOTE — Assessment & Plan Note (Signed)
For diflucan asd,  to f/u any worsening symptoms or concerns 

## 2017-02-16 NOTE — Progress Notes (Signed)
Subjective:    Patient ID: Casey Conley, female    DOB: 11-04-1955, 62 y.o.   MRN: [id number]  HPI  Here for wellness and f/u;  Overall doing ok;  Pt denies Chest pain, worsening SOB, DOE, wheezing, orthopnea, PND, worsening LE edema, palpitations, dizziness or syncope.  Pt denies neurological change such as new headache, facial or extremity weakness.  Pt denies polydipsia, polyuria, or low sugar symptoms. Pt states overall good compliance with treatment and medications, good tolerability, and has been trying to follow appropriate diet.  Pt denies worsening depressive symptoms, suicidal ideation or panic. No fever, night sweats, wt loss, loss of appetite, or other constitutional symptoms.  Pt states good ability with ADL's, has low fall risk, home safety reviewed and adequate, no other significant changes in hearing or vision, and only occasionally active with exercise. Does have tongue thrush without recent antibx for 2 wks.  No other interval hx or complaints Past Medical History:  Diagnosis Date  . ANEMIA-IRON DEFICIENCY 10/29/2008  . Dysuria 03/29/2009  . Fibroid, uterine   . History of fibrocystic disease of breast   . OSTEOPOROSIS 10/29/2008  . Osteoporosis    on Bisphosphonate for aprox 2007  . Proteinuria    24 hour-normal   Past Surgical History:  Procedure Laterality Date  . BREAST LUMPECTOMY  approx 1990   benign-left  . UTERINE FIBROID EMBOLIZATION     s/p    reports that  has never smoked. she has never used smokeless tobacco. She reports that she does not drink alcohol or use drugs. family history includes Arthritis in her mother; Breast cancer in her mother; Cancer in her father, maternal grandmother, mother, and paternal grandmother; Colon cancer in her father and maternal uncle; Dementia in her mother; Diabetes in her father and paternal grandfather; Heart disease in her father and paternal grandfather; Hyperlipidemia in her father; Osteoporosis in her mother; Transient  ischemic attack in her mother. No Known Allergies Current Outpatient Medications on File Prior to Visit  Medication Sig Dispense Refill  . Calcium Citrate-Vitamin D (CALCIUM + D PO) Take by mouth daily.    . Cholecalciferol (VITAMIN D PO) Take 5,000 Int'l Units by mouth daily.    . Doxylamine Succinate, Sleep, (SLEEP AID PO) Take by mouth at bedtime.    . Teriparatide, Recombinant, (FORTEO [place]) Inject into the skin.     No current facility-administered medications on file prior to visit.    Review of Systems Constitutional: Negative for other unusual diaphoresis, sweats, appetite or weight changes HENT: Negative for other worsening hearing loss, ear pain, facial swelling, mouth sores or neck stiffness.   Eyes: Negative for other worsening pain, redness or other visual disturbance.  Respiratory: Negative for other stridor or swelling Cardiovascular: Negative for other palpitations or other chest pain  Gastrointestinal: Negative for worsening diarrhea or loose stools, blood in stool, distention or other pain Genitourinary: Negative for hematuria, flank pain or other change in urine volume.  Musculoskeletal: Negative for myalgias or other joint swelling.  Skin: Negative for other color change, or other wound or worsening drainage.  Neurological: Negative for other syncope or numbness. Hematological: Negative for other adenopathy or swelling Psychiatric/Behavioral: Negative for hallucinations, other worsening agitation, SI, self-injury, or new decreased concentration All other system neg per pt    Objective:   Physical Exam BP 128/84   Pulse 85   Temp 97.9 F (36.6 C) (Oral)   Ht 5' 4.75" (1.645 m)   Wt 164 lb (74.4  kg)   LMP 09/02/2004   SpO2 100%   BMI 27.50 kg/m  VS noted,  Constitutional: Pt is oriented to person, place, and time. Appears well-developed and well-nourished, in no significant distress and comfortable Head: Normocephalic and atraumatic  Eyes: Conjunctivae and EOM  are normal. Pupils are equal, round, and reactive to light Right Ear: External ear normal without discharge Left Ear: External ear normal without discharge Nose: Nose without discharge or deformity Mouth/Throat: Oropharynx is without other ulcerations and moist  Neck: Normal range of motion. Neck supple. No JVD present. No tracheal deviation present or significant neck LA or mass Cardiovascular: Normal rate, regular rhythm, normal heart sounds and intact distal pulses.   Pulmonary/Chest: WOB normal and breath sounds without rales or wheezing  Abdominal: Soft. Bowel sounds are normal. NT. No HSM  Musculoskeletal: Normal range of motion. Exhibits no edema Lymphadenopathy: Has no other cervical adenopathy.  Neurological: Pt is alert and oriented to person, place, and time. Pt has normal reflexes. No cranial nerve deficit. Motor grossly intact, Gait intact Skin: Skin is warm and dry. No rash noted or new ulcerations Psychiatric:  Has normal mood and affect. Behavior is normal without agitation No other exam findings Lab Results  Component Value Date   WBC 4.9 07/04/2015   HGB 14.5 07/04/2015   HCT 43.7 07/04/2015   PLT 211 07/04/2015   GLUCOSE 94 07/04/2015   CHOL 209 (H) 07/04/2015   TRIG 65 07/04/2015   HDL 106 07/04/2015   LDLDIRECT 103.6 06/01/2012   LDLCALC 90 07/04/2015   ALT 17 08/28/2015   AST 19 08/28/2015   NA 138 07/04/2015   K 4.1 07/04/2015   CL 101 07/04/2015   CREATININE 0.77 07/04/2015   BUN 20 07/04/2015   CO2 25 07/04/2015   TSH 3.73 07/04/2015   HGBA1C 5.9 10/29/2008          Assessment & Plan:

## 2017-02-16 NOTE — Assessment & Plan Note (Signed)
Also for vit d level followup

## 2017-02-16 NOTE — Assessment & Plan Note (Signed)

## 2017-02-16 NOTE — Patient Instructions (Addendum)
You had the Tdap tetanus shot today  Please take all new medication as prescribed - the diflucan for thrush  Please continue all other medications as before, and refills have been done if requested.  Please have the pharmacy call with any other refills you may need.  Please continue your efforts at being more active, low cholesterol diet, and weight control.  You are otherwise up to date with prevention measures today.  Please keep your appointments with your specialists as you may have planned  Please go to the LAB in the Basement (turn left off the elevator) for the tests to be done tomorrow  You will be contacted by phone if any changes need to be made immediately.  Otherwise, you will receive a letter about your results with an explanation, but please check with MyChart first.  Please remember to sign up for MyChart if you have not done so, as this will be important to you in the future with finding out test results, communicating by private email, and scheduling acute appointments online when needed.  Please return in 1 year for your yearly visit, or sooner if needed, with Lab testing done 3-5 days before

## 2017-02-17 ENCOUNTER — Other Ambulatory Visit (INDEPENDENT_AMBULATORY_CARE_PROVIDER_SITE_OTHER): Payer: BLUE CROSS/BLUE SHIELD

## 2017-02-17 DIAGNOSIS — R739 Hyperglycemia, unspecified: Secondary | ICD-10-CM | POA: Diagnosis not present

## 2017-02-17 DIAGNOSIS — Z0000 Encounter for general adult medical examination without abnormal findings: Secondary | ICD-10-CM

## 2017-02-17 DIAGNOSIS — E559 Vitamin D deficiency, unspecified: Secondary | ICD-10-CM | POA: Diagnosis not present

## 2017-02-17 DIAGNOSIS — R7301 Impaired fasting glucose: Secondary | ICD-10-CM

## 2017-02-17 DIAGNOSIS — Z114 Encounter for screening for human immunodeficiency virus [HIV]: Secondary | ICD-10-CM

## 2017-02-17 LAB — URINALYSIS, ROUTINE W REFLEX MICROSCOPIC
Bilirubin Urine: NEGATIVE
Hgb urine dipstick: NEGATIVE
Ketones, ur: NEGATIVE
LEUKOCYTES UA: NEGATIVE
Nitrite: NEGATIVE
RBC / HPF: NONE SEEN (ref 0–?)
SPECIFIC GRAVITY, URINE: 1.015 (ref 1.000–1.030)
TOTAL PROTEIN, URINE-UPE24: NEGATIVE
URINE GLUCOSE: NEGATIVE
Urobilinogen, UA: 0.2 (ref 0.0–1.0)
pH: 6 (ref 5.0–8.0)

## 2017-02-17 LAB — CBC WITH DIFFERENTIAL/PLATELET
BASOS PCT: 0.6 % (ref 0.0–3.0)
Basophils Absolute: 0 10*3/uL (ref 0.0–0.1)
EOS ABS: 0.1 10*3/uL (ref 0.0–0.7)
Eosinophils Relative: 2.1 % (ref 0.0–5.0)
HEMATOCRIT: 43.3 % (ref 36.0–46.0)
HEMOGLOBIN: 14.3 g/dL (ref 12.0–15.0)
LYMPHS PCT: 38.4 % (ref 12.0–46.0)
Lymphs Abs: 2.6 10*3/uL (ref 0.7–4.0)
MCHC: 33.1 g/dL (ref 30.0–36.0)
MCV: 89 fl (ref 78.0–100.0)
MONO ABS: 0.6 10*3/uL (ref 0.1–1.0)
Monocytes Relative: 8.6 % (ref 3.0–12.0)
Neutro Abs: 3.4 10*3/uL (ref 1.4–7.7)
Neutrophils Relative %: 50.3 % (ref 43.0–77.0)
Platelets: 253 10*3/uL (ref 150.0–400.0)
RBC: 4.86 Mil/uL (ref 3.87–5.11)
RDW: 13.3 % (ref 11.5–15.5)
WBC: 6.8 10*3/uL (ref 4.0–10.5)

## 2017-02-17 LAB — VITAMIN D 25 HYDROXY (VIT D DEFICIENCY, FRACTURES): VITD: 49.45 ng/mL (ref 30.00–100.00)

## 2017-02-17 LAB — HEMOGLOBIN A1C: HEMOGLOBIN A1C: 6.1 % (ref 4.6–6.5)

## 2017-02-17 LAB — HEPATIC FUNCTION PANEL
ALK PHOS: 175 U/L — AB (ref 39–117)
ALT: 34 U/L (ref 0–35)
AST: 27 U/L (ref 0–37)
Albumin: 4.4 g/dL (ref 3.5–5.2)
BILIRUBIN DIRECT: 0.1 mg/dL (ref 0.0–0.3)
Total Bilirubin: 0.4 mg/dL (ref 0.2–1.2)
Total Protein: 7.2 g/dL (ref 6.0–8.3)

## 2017-02-17 LAB — BASIC METABOLIC PANEL
BUN: 20 mg/dL (ref 6–23)
CALCIUM: 10.6 mg/dL — AB (ref 8.4–10.5)
CO2: 29 mEq/L (ref 19–32)
CREATININE: 0.84 mg/dL (ref 0.40–1.20)
Chloride: 102 mEq/L (ref 96–112)
GFR: 73.12 mL/min (ref >60.00)
Glucose, Bld: 103 mg/dL — ABNORMAL HIGH (ref 70–99)
Potassium: 4 mEq/L (ref 3.5–5.1)
Sodium: 140 mEq/L (ref 135–145)

## 2017-02-17 LAB — TSH: TSH: 4.83 u[IU]/mL — AB (ref 0.35–4.50)

## 2017-02-17 LAB — LIPID PANEL
Cholesterol: 234 mg/dL — ABNORMAL HIGH (ref 0–200)
HDL: 91.9 mg/dL (ref >39.00)
LDL Cholesterol: 124 mg/dL — ABNORMAL HIGH (ref 0–99)
NONHDL: 142.35
Total CHOL/HDL Ratio: 3
Triglycerides: 90 mg/dL (ref 0.0–149.0)
VLDL: 18 mg/dL (ref 0.0–40.0)

## 2017-02-18 ENCOUNTER — Encounter: Payer: Self-pay | Admitting: Internal Medicine

## 2017-02-18 LAB — HIV ANTIBODY (ROUTINE TESTING W REFLEX): HIV 1&2 Ab, 4th Generation: NONREACTIVE

## 2017-02-23 ENCOUNTER — Encounter: Payer: Self-pay | Admitting: Obstetrics & Gynecology

## 2017-02-24 ENCOUNTER — Other Ambulatory Visit: Payer: Self-pay | Admitting: Internal Medicine

## 2017-02-24 NOTE — Telephone Encounter (Signed)
Done erx 

## 2017-03-31 DIAGNOSIS — R5383 Other fatigue: Secondary | ICD-10-CM | POA: Diagnosis not present

## 2017-03-31 DIAGNOSIS — Z8262 Family history of osteoporosis: Secondary | ICD-10-CM | POA: Diagnosis not present

## 2017-03-31 DIAGNOSIS — M810 Age-related osteoporosis without current pathological fracture: Secondary | ICD-10-CM | POA: Diagnosis not present

## 2017-03-31 DIAGNOSIS — M2550 Pain in unspecified joint: Secondary | ICD-10-CM | POA: Insufficient documentation

## 2017-03-31 DIAGNOSIS — B379 Candidiasis, unspecified: Secondary | ICD-10-CM | POA: Diagnosis not present

## 2017-07-22 DIAGNOSIS — R748 Abnormal levels of other serum enzymes: Secondary | ICD-10-CM | POA: Diagnosis not present

## 2017-08-13 ENCOUNTER — Encounter: Payer: Self-pay | Admitting: Internal Medicine

## 2017-08-13 ENCOUNTER — Ambulatory Visit (INDEPENDENT_AMBULATORY_CARE_PROVIDER_SITE_OTHER): Payer: BLUE CROSS/BLUE SHIELD | Admitting: Internal Medicine

## 2017-08-13 VITALS — BP 142/84 | HR 85 | Ht 64.75 in | Wt 164.0 lb

## 2017-08-13 DIAGNOSIS — L989 Disorder of the skin and subcutaneous tissue, unspecified: Secondary | ICD-10-CM

## 2017-08-13 DIAGNOSIS — B370 Candidal stomatitis: Secondary | ICD-10-CM

## 2017-08-13 DIAGNOSIS — R1310 Dysphagia, unspecified: Secondary | ICD-10-CM | POA: Diagnosis not present

## 2017-08-13 MED ORDER — MAGIC MOUTHWASH: 5.0000 mL | Freq: Three times a day (TID) | ORAL | 1 refills | Status: DC | PRN

## 2017-08-13 NOTE — Assessment & Plan Note (Signed)
C/w likely functional by hx, asympt for several days, to avoid eating quickly

## 2017-08-13 NOTE — Assessment & Plan Note (Signed)
Area of concern is c/w superficial varicosities, d/w pt, reassured,  to f/u any worsening symptoms or concerns

## 2017-08-13 NOTE — Assessment & Plan Note (Signed)
Ok for magic mouthwash prn

## 2017-08-13 NOTE — Progress Notes (Signed)
Subjective:    Patient ID: Casey Conley, female    DOB: 1955/07/25, 62 y.o.   MRN: [id number]  HPI  Here with an area to LLE a few cm below the knee and more medial aspect pretibial with a kind of swelling for 1 yr after a trauma with bruising now resolved but left with about 3 cm oval area of numerous small varicosities.  Worried about cancer and DVT after looking at [employer].  Also has a recurring tongue rash not better recently with nystatin.  Also c/o dysphagia last wk x 1 episode with fast eating, better with stopping eating, and Denies worsening reflux, abd pain, n/v, bowel change or blood. Past Medical History:  Diagnosis Date  . ANEMIA-IRON DEFICIENCY 10/29/2008  . Dysuria 03/29/2009  . Fibroid, uterine   . History of fibrocystic disease of breast   . OSTEOPOROSIS 10/29/2008  . Osteoporosis    on Bisphosphonate for aprox 2007  . Proteinuria    24 hour-normal   Past Surgical History:  Procedure Laterality Date  . BREAST LUMPECTOMY  approx 1990   benign-left  . UTERINE FIBROID EMBOLIZATION     s/p    reports that she has never smoked. She has never used smokeless tobacco. She reports that she does not drink alcohol or use drugs. family history includes Arthritis in her mother; Breast cancer in her mother; Cancer in her father, maternal grandmother, mother, and paternal grandmother; Colon cancer in her father and maternal uncle; Dementia in her mother; Diabetes in her father and paternal grandfather; Heart disease in her father and paternal grandfather; Hyperlipidemia in her father; Osteoporosis in her mother; Transient ischemic attack in her mother. No Known Allergies Current Outpatient Medications on File Prior to Visit  Medication Sig Dispense Refill  . Doxylamine Succinate, Sleep, (SLEEP AID PO) Take by mouth at bedtime.    . Insulin Pen Needle (BD PEN NEEDLE NANO U/F) 32G X 4 MM MISC Use as directed with Forteo    . Teriparatide, Recombinant, (FORTEO [place]) Inject into the skin.      . fluconazole (DIFLUCAN) 100 MG tablet TAKE ONE TABLET BY MOUTH ONE TIME DAILY  (Patient not taking: Reported on 08/13/2017) 7 tablet 0   No current facility-administered medications on file prior to visit.    Review of Systems  Constitutional: Negative for other unusual diaphoresis or sweats HENT: Negative for ear discharge or swelling Eyes: Negative for other worsening visual disturbances Respiratory: Negative for stridor or other swelling  Gastrointestinal: Negative for worsening distension or other blood Genitourinary: Negative for retention or other urinary change Musculoskeletal: Negative for other MSK pain or swelling Skin: Negative for color change or other new lesions Neurological: Negative for worsening tremors and other numbness  Psychiatric/Behavioral: Negative for worsening agitation or other fatigue All other system neg per pt    Objective:   Physical Exam BP (!) 142/84 (BP Location: Left Arm, Patient Position: Sitting, Cuff Size: Normal)   Pulse 85   Ht 5' 4.75" (1.645 m)   Wt 164 lb (74.4 kg)   LMP 09/02/2004   SpO2 97%   BMI 27.50 kg/m  VS noted,  Constitutional: Pt appears in NAD HENT: Head: NCAT.  Right Ear: External ear normal.  Left Ear: External ear normal.  Eyes: . Pupils are equal, round, and reactive to light.  Tongue with whitish rash Conjunctivae and EOM are normal Nose: without d/c or deformity Neck: Neck supple. Gross normal ROM Cardiovascular: Normal rate and regular rhythm.  Pulmonary/Chest: Effort normal and breath sounds without rales or wheezing.  Abd:  Soft, NT, ND, + BS, no HSM Neurological: Pt is alert. At baseline orientation, motor grossly intact Skin: Skin is warm. No rashes, no LE edema, but does have a benign appearing 3 cm area with minor local swelling and numerous very small varicosities Psychiatric: Pt behavior is normal without agitation  No other exam findings    Assessment & Plan:

## 2017-08-13 NOTE — Patient Instructions (Signed)
Please take all new medication as prescribed - the mouthwash  Please continue all other medications as before, and refills have been done if requested.  Please have the pharmacy call with any other refills you may need.  Please keep your appointments with your specialists as you may have planned

## 2017-08-16 ENCOUNTER — Ambulatory Visit: Payer: BLUE CROSS/BLUE SHIELD | Admitting: Internal Medicine

## 2017-08-17 ENCOUNTER — Other Ambulatory Visit: Payer: Self-pay | Admitting: Internal Medicine

## 2017-08-17 ENCOUNTER — Telehealth: Payer: Self-pay | Admitting: Internal Medicine

## 2017-08-17 NOTE — Telephone Encounter (Signed)
Equal parts of viscous lidocaine, Benadryl liquid, nystatin suspension, and Prelone liquid

## 2017-08-17 NOTE — Telephone Encounter (Signed)
Copied from [place] [phone] [id number]. Topic: General - Other >> Aug 17, 2017  3:58 PM Judyann Munson wrote: Reason for CRM:  Patient is calling in regards to a medication magic mouthwash SOLN. She is stating that [place] is needing the correct instruction on how to mix the medication. The pharmacy contact information is  [facility] [address] [place] [phone] (Phone)   [phone] (Fax). Please advise

## 2017-08-17 NOTE — Telephone Encounter (Signed)
Dr. Ronnald Ramp please clarify mixing instructions since PCP is out of office. I will inform the pharmacy. Thank you!

## 2017-08-17 NOTE — Telephone Encounter (Signed)
Pharmacist has been informed of below instructions.

## 2017-11-08 DIAGNOSIS — M25531 Pain in right wrist: Secondary | ICD-10-CM | POA: Diagnosis not present

## 2017-12-29 ENCOUNTER — Other Ambulatory Visit (HOSPITAL_COMMUNITY)
Admission: RE | Admit: 2017-12-29 | Discharge: 2017-12-29 | Disposition: A | Discharge status: Home / Self Care | Payer: BLUE CROSS/BLUE SHIELD | Source: Ambulatory Visit | Attending: Certified Nurse Midwife | Admitting: Certified Nurse Midwife

## 2017-12-29 ENCOUNTER — Encounter: Payer: Self-pay | Admitting: Certified Nurse Midwife

## 2017-12-29 ENCOUNTER — Ambulatory Visit (INDEPENDENT_AMBULATORY_CARE_PROVIDER_SITE_OTHER): Payer: BLUE CROSS/BLUE SHIELD | Admitting: Certified Nurse Midwife

## 2017-12-29 ENCOUNTER — Other Ambulatory Visit: Payer: Self-pay

## 2017-12-29 VITALS — BP 112/70 | HR 68 | Resp 16 | Ht 64.5 in | Wt 164.0 lb

## 2017-12-29 DIAGNOSIS — Z01419 Encounter for gynecological examination (general) (routine) without abnormal findings: Secondary | ICD-10-CM

## 2017-12-29 DIAGNOSIS — K640 First degree hemorrhoids: Secondary | ICD-10-CM

## 2017-12-29 DIAGNOSIS — Z124 Encounter for screening for malignant neoplasm of cervix: Secondary | ICD-10-CM

## 2017-12-29 DIAGNOSIS — N952 Postmenopausal atrophic vaginitis: Secondary | ICD-10-CM

## 2017-12-29 NOTE — Progress Notes (Signed)
62 y.o. G0P0000 Married  Caucasian Fe here for annual exam. Post menopausal no HRT. Denies vaginal bleeding or dryness.Marland Kitchen Has history of hemorrhoids and had bowel movement with slight amount of blood when wiping 4 days ago. Family history of colon cancer and polyp with last colonoscopy. Has noted no black tarry stools or blood in stool. Has increased water intake and this does help. Sees Dr. Cathlean Cower for aex, labs. Taking Forteo for osteoporosis without issues. Due for BMD 2020. No other health issues. "Feels she is slowing down some, but still works 40 + hours!  Patient's last menstrual period was 09/02/2004.          Sexually active: No.  The current method of family planning is post menopausal status.    Exercising: Yes.    osteostrong Smoker:  no  Review of Systems  Constitutional: Negative.   HENT: Negative.   Eyes: Negative.   Respiratory: Negative.   Cardiovascular: Negative.   Gastrointestinal:       Blood 4 hrs after bowel movement. Has hemorrhoids  Genitourinary: Negative.   Musculoskeletal: Negative.   Skin: Negative.   Neurological: Negative.   Endo/Heme/Allergies: Negative.   Psychiatric/Behavioral: Negative.     Health Maintenance: Pap:  07-04-15 neg History of Abnormal Pap: no MMG:  02-08-17 category c density birads 1:neg Self Breast exams: no Colonoscopy:  2018 f/u 50yrs, one polyp BMD:   2018 osteoporosis on Forteo TDaP:  2019 Shingles: 2019 Pneumonia: not done Hep C and HIV: hep c neg 2017, HIV neg 2019 Labs: no   reports that she has never smoked. She has never used smokeless tobacco. She reports that she does not drink alcohol or use drugs.  Past Medical History:  Diagnosis Date  . ANEMIA-IRON DEFICIENCY 10/29/2008  . Dysuria 03/29/2009  . Fibroid, uterine   . History of fibrocystic disease of breast   . OSTEOPOROSIS 10/29/2008  . Osteoporosis    on Bisphosphonate for aprox 2007  . Proteinuria    24 hour-normal    Past Surgical History:  Procedure  Laterality Date  . BREAST LUMPECTOMY  approx 1990   benign-left  . UTERINE FIBROID EMBOLIZATION     s/p    Current Outpatient Medications  Medication Sig Dispense Refill  . Doxylamine Succinate, Sleep, (SLEEP AID PO) Take by mouth at bedtime.    . Insulin Pen Needle (BD PEN NEEDLE NANO U/F) 32G X 4 MM MISC Use as directed with Forteo    . Teriparatide, Recombinant, (FORTEO) 600 MCG/2.4ML SOLN Inject 67mcg subcutaneously daily as directed. Keep refrigerated.    Marland Kitchen VITAMIN D-VITAMIN K PO Take by mouth.     No current facility-administered medications for this visit.     Family History  Problem Relation Age of Onset  . Cancer Mother        Breast Cancer  . Arthritis Mother   . Dementia Mother   . Transient ischemic attack Mother   . Osteoporosis Mother   . Breast cancer Mother   . Cancer Father        Lung Cancer, Colon Cancer  . Diabetes Father   . Hyperlipidemia Father   . Heart disease Father        CAD/CABG  . Colon cancer Father   . Diabetes Paternal Grandfather   . Heart disease Paternal Grandfather   . Cancer Maternal Grandmother        cecum cancer  . Cancer Paternal Grandmother        "female"  .  Colon cancer Maternal Uncle     ROS:  Pertinent items are noted in HPI.  Otherwise, a comprehensive ROS was negative.  Exam:   BP 112/70   Pulse 68   Resp 16   Ht 5' 4.5" (1.638 m)   Wt 164 lb (74.4 kg)   LMP 09/02/2004   BMI 27.72 kg/m  Height: 5' 4.5" (163.8 cm) Ht Readings from Last 3 Encounters:  12/29/17 5' 4.5" (1.638 m)  08/13/17 5' 4.75" (1.645 m)  02/16/17 5' 4.75" (1.645 m)    General appearance: alert, cooperative and appears stated age Head: Normocephalic, without obvious abnormality, atraumatic Neck: no adenopathy, supple, symmetrical, trachea midline and thyroid normal to inspection and palpation Lungs: clear to auscultation bilaterally Breasts: normal appearance, no masses or tenderness, No nipple retraction or dimpling, No nipple discharge  or bleeding, No axillary or supraclavicular adenopathy, scar from lump removal on right breast Heart: regular rate and rhythm Abdomen: soft, non-tender; no masses,  no organomegaly Extremities: extremities normal, atraumatic, no cyanosis or edema Skin: Skin color, texture, turgor normal. No rashes or lesions Lymph nodes: Cervical, supraclavicular, and axillary nodes normal. No abnormal inguinal nodes palpated Neurologic: Grossly normal   Pelvic: External genitalia:  no lesions              Urethra:  normal appearing urethra with no masses, tenderness or lesions              Bartholin's and Skene's: normal                 Vagina: normal appearing vagina with normal color and discharge, no lesions              Cervix: no cervical motion tenderness, no lesions and retroverted              Pap taken: Yes.   Bimanual Exam:  Uterus:  anteflexed              Adnexa: normal adnexa and no mass, fullness, tenderness               Rectovaginal: Confirms               Anus:  normal sphincter tone, hemorrhoid noted inside anal opening at 9 o'clock, no blood noted with rectal exam  Chaperone present: yes  A:  Well Woman with normal exam  Menopausal no HRT  Atrophic vaginitis  History of hemorrhoids, none thrombosed   Osteoporosis on Forteo BMD due 2020  P:   Reviewed health and wellness pertinent to exam  Aware of need to advise if vaginal bleeding  Discussed finding and moisture option of Olive oil or coconut oil if she desires for dryness  Discussed hemorrhoids finding, no thrombosed noted. Start on stool softener. Seek GI if black tarry stools.  Patient will call if problems with Forteo and aware BMD due next year   Pap smear: yes   counseled on breast self exam, mammography screening, osteoporosis, adequate intake of calcium and vitamin D, diet and exercise, Kegel's exercises  return annually or prn  An After Visit Summary was printed and given to the patient.

## 2017-12-29 NOTE — Patient Instructions (Signed)

## 2018-01-03 LAB — CYTOLOGY - PAP
Diagnosis: NEGATIVE
HPV (WINDOPATH): DETECTED — AB
HPV 16/18/45 genotyping: NEGATIVE

## 2018-02-09 ENCOUNTER — Encounter: Payer: Self-pay | Admitting: Obstetrics & Gynecology

## 2018-02-09 DIAGNOSIS — Z1231 Encounter for screening mammogram for malignant neoplasm of breast: Secondary | ICD-10-CM | POA: Diagnosis not present

## 2018-02-09 DIAGNOSIS — Z803 Family history of malignant neoplasm of breast: Secondary | ICD-10-CM | POA: Diagnosis not present

## 2018-03-30 DIAGNOSIS — M810 Age-related osteoporosis without current pathological fracture: Secondary | ICD-10-CM | POA: Diagnosis not present

## 2018-03-30 DIAGNOSIS — Z8262 Family history of osteoporosis: Secondary | ICD-10-CM | POA: Diagnosis not present

## 2018-04-15 ENCOUNTER — Encounter: Payer: Self-pay | Admitting: Internal Medicine

## 2018-04-15 MED ORDER — FLUCONAZOLE 100 MG PO TABS: 100.0000 mg | ORAL_TABLET | Freq: Every day | ORAL | 0 refills | Status: DC

## 2018-04-15 NOTE — Telephone Encounter (Signed)
Md is out of the office pls advise on email.Marland KitchenJohny Chess

## 2018-07-04 DIAGNOSIS — M810 Age-related osteoporosis without current pathological fracture: Secondary | ICD-10-CM | POA: Diagnosis not present

## 2018-07-04 DIAGNOSIS — Z8262 Family history of osteoporosis: Secondary | ICD-10-CM | POA: Diagnosis not present

## 2018-07-19 DIAGNOSIS — M810 Age-related osteoporosis without current pathological fracture: Secondary | ICD-10-CM | POA: Diagnosis not present

## 2018-07-19 DIAGNOSIS — Z79810 Long term (current) use of selective estrogen receptor modulators (SERMs): Secondary | ICD-10-CM | POA: Diagnosis not present

## 2018-07-19 DIAGNOSIS — R5383 Other fatigue: Secondary | ICD-10-CM | POA: Diagnosis not present

## 2018-07-19 DIAGNOSIS — Z79899 Other long term (current) drug therapy: Secondary | ICD-10-CM | POA: Diagnosis not present

## 2018-09-07 ENCOUNTER — Ambulatory Visit (INDEPENDENT_AMBULATORY_CARE_PROVIDER_SITE_OTHER): Payer: BLUE CROSS/BLUE SHIELD | Admitting: Internal Medicine

## 2018-09-07 ENCOUNTER — Encounter: Payer: Self-pay | Admitting: Internal Medicine

## 2018-09-07 ENCOUNTER — Other Ambulatory Visit (INDEPENDENT_AMBULATORY_CARE_PROVIDER_SITE_OTHER): Payer: BLUE CROSS/BLUE SHIELD

## 2018-09-07 ENCOUNTER — Other Ambulatory Visit: Payer: Self-pay

## 2018-09-07 VITALS — BP 136/88 | HR 82 | Temp 98.1°F | Ht 64.5 in | Wt 166.0 lb

## 2018-09-07 DIAGNOSIS — E611 Iron deficiency: Secondary | ICD-10-CM

## 2018-09-07 DIAGNOSIS — B370 Candidal stomatitis: Secondary | ICD-10-CM

## 2018-09-07 DIAGNOSIS — E538 Deficiency of other specified B group vitamins: Secondary | ICD-10-CM

## 2018-09-07 DIAGNOSIS — E559 Vitamin D deficiency, unspecified: Secondary | ICD-10-CM | POA: Diagnosis not present

## 2018-09-07 DIAGNOSIS — Z0001 Encounter for general adult medical examination with abnormal findings: Secondary | ICD-10-CM

## 2018-09-07 DIAGNOSIS — R739 Hyperglycemia, unspecified: Secondary | ICD-10-CM | POA: Diagnosis not present

## 2018-09-07 DIAGNOSIS — K148 Other diseases of tongue: Secondary | ICD-10-CM | POA: Diagnosis not present

## 2018-09-07 DIAGNOSIS — K0510 Chronic gingivitis, plaque induced: Secondary | ICD-10-CM | POA: Diagnosis not present

## 2018-09-07 LAB — CBC WITH DIFFERENTIAL/PLATELET
Basophils Absolute: 0 10*3/uL (ref 0.0–0.1)
Basophils Relative: 0.4 % (ref 0.0–3.0)
Eosinophils Absolute: 0.1 10*3/uL (ref 0.0–0.7)
Eosinophils Relative: 1.3 % (ref 0.0–5.0)
HCT: 45.8 % (ref 36.0–46.0)
Hemoglobin: 15 g/dL (ref 12.0–15.0)
Lymphocytes Relative: 35.7 % (ref 12.0–46.0)
Lymphs Abs: 2.8 10*3/uL (ref 0.7–4.0)
MCHC: 32.8 g/dL (ref 30.0–36.0)
MCV: 88.7 fl (ref 78.0–100.0)
Monocytes Absolute: 0.6 10*3/uL (ref 0.1–1.0)
Monocytes Relative: 7.9 % (ref 3.0–12.0)
Neutro Abs: 4.2 10*3/uL (ref 1.4–7.7)
Neutrophils Relative %: 54.7 % (ref 43.0–77.0)
Platelets: 219 10*3/uL (ref 150.0–400.0)
RBC: 5.17 Mil/uL — ABNORMAL HIGH (ref 3.87–5.11)
RDW: 14.1 % (ref 11.5–15.5)
WBC: 7.8 10*3/uL (ref 4.0–10.5)

## 2018-09-07 MED ORDER — AMOXICILLIN 500 MG PO CAPS: 1000.0000 mg | ORAL_CAPSULE | Freq: Two times a day (BID) | ORAL | 0 refills | Status: DC

## 2018-09-07 MED ORDER — FLUCONAZOLE 100 MG PO TABS: 100.0000 mg | ORAL_TABLET | Freq: Every day | ORAL | 0 refills | Status: DC

## 2018-09-07 NOTE — Assessment & Plan Note (Signed)
For short course diflucan oral,  to f/u any worsening symptoms or concerns

## 2018-09-07 NOTE — Assessment & Plan Note (Addendum)
?   Cystic lesion, cant r/o malignancy, for oral surgury referral  In addition to the time spent performing CPE, I spent an additional 25 minutes face to face,in which greater than 50% of this time was spent in counseling and coordination of care for patient's acute illness as documented, including the differential dx, treatment, further evaluation and other management of tongue lesion, thrush, vit d deficiency, gingivitis and hyperglycemia

## 2018-09-07 NOTE — Assessment & Plan Note (Signed)
Mild to mod, for antibx course,  to f/u any worsening symptoms or concerns 

## 2018-09-07 NOTE — Assessment & Plan Note (Signed)
Also for vit d level with labs

## 2018-09-07 NOTE — Assessment & Plan Note (Signed)
stable overall by history and exam, recent data reviewed with pt, and pt to continue medical treatment as before,  to f/u any worsening symptoms or concerns  

## 2018-09-07 NOTE — Patient Instructions (Signed)
Please take all new medication as prescribed - the antibiotic and diflucan  Please continue all other medications as before, and refills have been done if requested.  Please have the pharmacy call with any other refills you may need.  Please continue your efforts at being more active, low cholesterol diet, and weight control.  You are otherwise up to date with prevention measures today.  Please keep your appointments with your specialists as you may have planned  You will be contacted regarding the referral for: Oral Surgury  Please go to the LAB in the Basement (turn left off the elevator) for the tests to be done today  You will be contacted by phone if any changes need to be made immediately.  Otherwise, you will receive a letter about your results with an explanation, but please check with MyChart first.  Please remember to sign up for MyChart if you have not done so, as this will be important to you in the future with finding out test results, communicating by private email, and scheduling acute appointments online when needed.  Please return in 1 year for your yearly visit, or sooner if needed, with Lab testing done 3-5 days before

## 2018-09-07 NOTE — Assessment & Plan Note (Signed)

## 2018-09-07 NOTE — Progress Notes (Signed)
Subjective:    Patient ID: Casey Conley, female    DOB: [birth year], 63 y.o.   MRN: [id number]  HPI  Here for wellness and f/u;  Overall doing ok;  Pt denies Chest pain, worsening SOB, DOE, wheezing, orthopnea, PND, worsening LE edema, palpitations, dizziness or syncope.  Pt denies neurological change such as new headache, facial or extremity weakness.  Pt denies polydipsia, polyuria, or low sugar symptoms. Pt states overall good compliance with treatment and medications, good tolerability, and has been trying to follow appropriate diet.  Pt denies worsening depressive symptoms, suicidal ideation or panic. No fever, night sweats, wt loss, loss of appetite, or other constitutional symptoms.  Pt states good ability with ADL's, has low fall risk, home safety reviewed and adequate, no other significant changes in hearing or vision, and only occasionally active with exercise. BP Readings from Last 3 Encounters:  09/07/18 136/88  12/29/17 112/70  08/13/17 (!) 142/84   Wt Readings from Last 3 Encounters:  09/07/18 166 lb (75.3 kg)  12/29/17 164 lb (74.4 kg)  08/13/17 164 lb (74.4 kg)  now on prolia for osteoporosis. Also states has Hx of thrush for years, today with ? Gum infection to the upper mid frontal with red tender swelling x several days, and also with a lesion small bump to the right distal tongue x 8 mo without pain or trauma, wondering if related to thrush or something else, nothing seems to make better or worse, no prior hx of same Past Medical History:  Diagnosis Date  . ANEMIA-IRON DEFICIENCY 10/29/2008  . Dysuria 03/29/2009  . Fibroid, uterine   . History of fibrocystic disease of breast   . OSTEOPOROSIS 10/29/2008  . Osteoporosis    on Bisphosphonate for aprox 2007  . Proteinuria    24 hour-normal   Past Surgical History:  Procedure Laterality Date  . BREAST LUMPECTOMY  approx 1990   benign-left  . UTERINE FIBROID EMBOLIZATION     s/p    reports that she has never smoked. She  has never used smokeless tobacco. She reports that she does not drink alcohol or use drugs. family history includes Arthritis in her mother; Breast cancer in her mother; Cancer in her father, maternal grandmother, mother, and paternal grandmother; Colon cancer in her father and maternal uncle; Dementia in her mother; Diabetes in her father and paternal grandfather; Heart disease in her father and paternal grandfather; Hyperlipidemia in her father; Osteoporosis in her mother; Transient ischemic attack in her mother. No Known Allergies Current Outpatient Medications on File Prior to Visit  Medication Sig Dispense Refill  . Doxylamine Succinate, Sleep, (SLEEP AID PO) Take by mouth at bedtime.    . Insulin Pen Needle (BD PEN NEEDLE NANO U/F) 32G X 4 MM MISC Use as directed with Forteo    . Teriparatide, Recombinant, (FORTEO) 600 MCG/2.4ML SOLN Inject 30mcg subcutaneously daily as directed. Keep refrigerated.    Marland Kitchen VITAMIN D-VITAMIN K PO Take by mouth.     No current facility-administered medications on file prior to visit.    Review of Systems Constitutional: Negative for other unusual diaphoresis, sweats, appetite or weight changes HENT: Negative for other worsening hearing loss, ear pain, facial swelling, mouth sores or neck stiffness.   Eyes: Negative for other worsening pain, redness or other visual disturbance.  Respiratory: Negative for other stridor or swelling Cardiovascular: Negative for other palpitations or other chest pain  Gastrointestinal: Negative for worsening diarrhea or loose stools, blood in stool, distention or other  pain Genitourinary: Negative for hematuria, flank pain or other change in urine volume.  Musculoskeletal: Negative for myalgias or other joint swelling.  Skin: Negative for other color change, or other wound or worsening drainage.  Neurological: Negative for other syncope or numbness. Hematological: Negative for other adenopathy or swelling Psychiatric/Behavioral:  Negative for hallucinations, other worsening agitation, SI, self-injury, or new decreased concentration All other system neg per pt    Objective:   Physical Exam BP 136/88   Pulse 82   Temp 98.1 F (36.7 C) (Oral)   Ht 5' 4.5" (1.638 m)   Wt 166 lb (75.3 kg)   LMP 09/02/2004   SpO2 97%   BMI 28.05 kg/m  VS noted,  Constitutional: Pt is oriented to person, place, and time. Appears well-developed and well-nourished, in no significant distress and comfortable Head: Normocephalic and atraumatic  Eyes: Conjunctivae and EOM are normal. Pupils are equal, round, and reactive to light Right Ear: External ear normal without discharge Left Ear: External ear normal without discharge Nose: Nose without discharge or deformity Mouth/Throat: Oropharynx is without other ulcerations and moist but has area red tender swelling to upper gum above incisors, also tongue right distal aspect with 5 mm raised nontender non ulcerated Neck: Normal range of motion. Neck supple. No JVD present. No tracheal deviation present or significant neck LA or mass Cardiovascular: Normal rate, regular rhythm, normal heart sounds and intact distal pulses.   Pulmonary/Chest: WOB normal and breath sounds without rales or wheezing  Abdominal: Soft. Bowel sounds are normal. NT. No HSM  Musculoskeletal: Normal range of motion. Exhibits no edema Lymphadenopathy: Has no other cervical adenopathy.  Neurological: Pt is alert and oriented to person, place, and time. Pt has normal reflexes. No cranial nerve deficit. Motor grossly intact, Gait intact Skin: Skin is warm and dry. No rash noted or new ulcerations Psychiatric:  Has normal mood and affect. Behavior is normal without agitation No other exam findings  Lab Results  Component Value Date   WBC 6.8 02/17/2017   HGB 14.3 02/17/2017   HCT 43.3 02/17/2017   PLT 253.0 02/17/2017   GLUCOSE 103 (H) 02/17/2017   CHOL 234 (H) 02/17/2017   TRIG 90.0 02/17/2017   HDL 91.90  02/17/2017   LDLDIRECT 103.6 06/01/2012   LDLCALC 124 (H) 02/17/2017   ALT 34 02/17/2017   AST 27 02/17/2017   NA 140 02/17/2017   K 4.0 02/17/2017   CL 102 02/17/2017   CREATININE 0.84 02/17/2017   BUN 20 02/17/2017   CO2 29 02/17/2017   TSH 4.83 (H) 02/17/2017   HGBA1C 6.1 02/17/2017       Assessment & Plan:

## 2018-09-08 LAB — HEPATIC FUNCTION PANEL
ALT: 31 U/L (ref 0–35)
AST: 25 U/L (ref 0–37)
Albumin: 4.7 g/dL (ref 3.5–5.2)
Alkaline Phosphatase: 108 U/L (ref 39–117)
Bilirubin, Direct: 0.1 mg/dL (ref 0.0–0.3)
Total Bilirubin: 0.3 mg/dL (ref 0.2–1.2)
Total Protein: 7.6 g/dL (ref 6.0–8.3)

## 2018-09-08 LAB — URINALYSIS, ROUTINE W REFLEX MICROSCOPIC
Bilirubin Urine: NEGATIVE
Hgb urine dipstick: NEGATIVE
Ketones, ur: NEGATIVE
Nitrite: NEGATIVE
RBC / HPF: NONE SEEN (ref 0–?)
Specific Gravity, Urine: 1.015 (ref 1.000–1.030)
Total Protein, Urine: NEGATIVE
Urine Glucose: NEGATIVE
Urobilinogen, UA: 0.2 (ref 0.0–1.0)
pH: 7.5 (ref 5.0–8.0)

## 2018-09-08 LAB — TSH: TSH: 3.77 u[IU]/mL (ref 0.35–4.50)

## 2018-09-08 LAB — LIPID PANEL
Cholesterol: 225 mg/dL — ABNORMAL HIGH (ref 0–200)
HDL: 98.6 mg/dL (ref >39.00)
LDL Cholesterol: 104 mg/dL — ABNORMAL HIGH (ref 0–99)
NonHDL: 126.76
Total CHOL/HDL Ratio: 2
Triglycerides: 114 mg/dL (ref 0.0–149.0)
VLDL: 22.8 mg/dL (ref 0.0–40.0)

## 2018-09-08 LAB — HEMOGLOBIN A1C: Hgb A1c MFr Bld: 6.1 % (ref 4.6–6.5)

## 2018-09-08 LAB — IBC PANEL
Iron: 78 ug/dL (ref 42–145)
Saturation Ratios: 18.3 % — ABNORMAL LOW (ref 20.0–50.0)
Transferrin: 305 mg/dL (ref 212.0–360.0)

## 2018-09-08 LAB — BASIC METABOLIC PANEL
BUN: 15 mg/dL (ref 6–23)
CO2: 25 mEq/L (ref 19–32)
Calcium: 9.6 mg/dL (ref 8.4–10.5)
Chloride: 105 mEq/L (ref 96–112)
Creatinine, Ser: 0.74 mg/dL (ref 0.40–1.20)
GFR: 79.22 mL/min (ref >60.00)
Glucose, Bld: 94 mg/dL (ref 70–99)
Potassium: 4.1 mEq/L (ref 3.5–5.1)
Sodium: 139 mEq/L (ref 135–145)

## 2018-09-08 LAB — VITAMIN B12: Vitamin B-12: 326 pg/mL (ref 211–911)

## 2018-09-08 LAB — VITAMIN D 25 HYDROXY (VIT D DEFICIENCY, FRACTURES): VITD: 55.83 ng/mL (ref 30.00–100.00)

## 2018-09-12 ENCOUNTER — Telehealth: Payer: Self-pay

## 2018-09-12 NOTE — Telephone Encounter (Signed)
Spoke to pt and faxed referral to [facility] [place]

## 2018-09-12 NOTE — Telephone Encounter (Signed)
Copied from [place] [id number]. Topic: Referral - Status >> Sep 12, 2018 11:36 AM Reyne Dumas L wrote: Reason for CRM:   Pt called and states that she hasn't heard anything about her referral to an oral surgeon and would like to know who that was sent to and when she should expect to hear something.

## 2018-09-19 ENCOUNTER — Encounter: Payer: Self-pay | Admitting: Internal Medicine

## 2018-09-19 DIAGNOSIS — K136 Irritative hyperplasia of oral mucosa: Secondary | ICD-10-CM | POA: Diagnosis not present

## 2018-11-10 DIAGNOSIS — M545 Low back pain: Secondary | ICD-10-CM | POA: Diagnosis not present

## 2018-11-10 DIAGNOSIS — M25511 Pain in right shoulder: Secondary | ICD-10-CM | POA: Diagnosis not present

## 2018-11-10 DIAGNOSIS — M4716 Other spondylosis with myelopathy, lumbar region: Secondary | ICD-10-CM | POA: Diagnosis not present

## 2018-11-10 DIAGNOSIS — M4316 Spondylolisthesis, lumbar region: Secondary | ICD-10-CM | POA: Diagnosis not present

## 2018-12-28 ENCOUNTER — Other Ambulatory Visit: Payer: Self-pay

## 2019-01-02 ENCOUNTER — Ambulatory Visit (INDEPENDENT_AMBULATORY_CARE_PROVIDER_SITE_OTHER): Payer: BC Managed Care – PPO | Admitting: Certified Nurse Midwife

## 2019-01-02 ENCOUNTER — Other Ambulatory Visit: Payer: Self-pay

## 2019-01-02 ENCOUNTER — Other Ambulatory Visit (HOSPITAL_COMMUNITY)
Admission: RE | Admit: 2019-01-02 | Discharge: 2019-01-02 | Disposition: A | Discharge status: Home / Self Care | Payer: BC Managed Care – PPO | Source: Ambulatory Visit | Attending: Certified Nurse Midwife | Admitting: Certified Nurse Midwife

## 2019-01-02 ENCOUNTER — Encounter: Payer: Self-pay | Admitting: Certified Nurse Midwife

## 2019-01-02 VITALS — BP 120/78 | HR 70 | Temp 97.1°F | Resp 16 | Ht 65.0 in | Wt 168.0 lb

## 2019-01-02 DIAGNOSIS — Z01419 Encounter for gynecological examination (general) (routine) without abnormal findings: Secondary | ICD-10-CM

## 2019-01-02 DIAGNOSIS — Z8742 Personal history of other diseases of the female genital tract: Secondary | ICD-10-CM

## 2019-01-02 DIAGNOSIS — Z124 Encounter for screening for malignant neoplasm of cervix: Secondary | ICD-10-CM | POA: Insufficient documentation

## 2019-01-02 DIAGNOSIS — N893 Dysplasia of vagina, unspecified: Secondary | ICD-10-CM | POA: Diagnosis not present

## 2019-01-02 DIAGNOSIS — N952 Postmenopausal atrophic vaginitis: Secondary | ICD-10-CM

## 2019-01-02 DIAGNOSIS — B370 Candidal stomatitis: Secondary | ICD-10-CM

## 2019-01-02 NOTE — Progress Notes (Signed)
63 y.o. G0P0000 Married  Caucasian Fe here for annual exam. Post menopausal no vaginal bleeding or vaginal dryness. Patient has questions regarding negative pap smear and positive HPV, 16, 18,45.  Patient has persistent thrush in mouth and is seeing oral surgeon regarding. Sees PCP Dr. Jeneen Rinks for aex and labs. No health issues today.  Patient's last menstrual period was 09/02/2004.          Sexually active: No.  The current method of family planning is post menopausal status.    Exercising: No.  exercise Smoker:  no  Review of Systems  Constitutional: Negative.   HENT: Negative.   Eyes: Negative.   Respiratory: Negative.   Cardiovascular: Negative.   Gastrointestinal: Negative.   Genitourinary: Negative.   Musculoskeletal: Negative.   Skin:       thrush  Neurological: Negative.   Endo/Heme/Allergies: Negative.   Psychiatric/Behavioral: Negative.     Health Maintenance: Pap:  07-04-15 neg, 12-29-17 neg HPV HR+ 16,18/45 neg History of Abnormal Pap: yes MMG:  02-09-2018 category c density birads 1:neg Self Breast exams: no Colonoscopy:  2018 f/u 36yrs, 1 polyp BMD:   2020 osteoporosis on prolia TDaP:  2019 Shingles: 2019 Pneumonia: not done Hep C and HIV: hep c neg 2017, HIV neg 2019 Labs: if needed   reports that she has never smoked. She has never used smokeless tobacco. She reports that she does not drink alcohol or use drugs.  Past Medical History:  Diagnosis Date  . ANEMIA-IRON DEFICIENCY 10/29/2008  . Dysuria 03/29/2009  . Fibroid, uterine   . History of fibrocystic disease of breast   . OSTEOPOROSIS 10/29/2008  . Osteoporosis    on Bisphosphonate for aprox 2007  . Proteinuria    24 hour-normal    Past Surgical History:  Procedure Laterality Date  . BREAST LUMPECTOMY  approx 1990   benign-left  . UTERINE FIBROID EMBOLIZATION     s/p    Current Outpatient Medications  Medication Sig Dispense Refill  . amoxicillin (AMOXIL) 500 MG capsule Take 2 capsules (1,000 mg  total) by mouth 2 (two) times daily. 28 capsule 0  . Doxylamine Succinate, Sleep, (SLEEP AID PO) Take by mouth at bedtime.    . fluconazole (DIFLUCAN) 100 MG tablet Take 1 tablet (100 mg total) by mouth daily. 7 tablet 0  . Insulin Pen Needle (BD PEN NEEDLE NANO U/F) 32G X 4 MM MISC Use as directed with Forteo    . Teriparatide, Recombinant, (FORTEO) 600 MCG/2.4ML SOLN Inject 14mcg subcutaneously daily as directed. Keep refrigerated.    Marland Kitchen VITAMIN D-VITAMIN K PO Take by mouth.     No current facility-administered medications for this visit.     Family History  Problem Relation Age of Onset  . Cancer Mother        Breast Cancer  . Arthritis Mother   . Dementia Mother   . Transient ischemic attack Mother   . Osteoporosis Mother   . Breast cancer Mother   . Cancer Father        Lung Cancer, Colon Cancer  . Diabetes Father   . Hyperlipidemia Father   . Heart disease Father        CAD/CABG  . Colon cancer Father   . Diabetes Paternal Grandfather   . Heart disease Paternal Grandfather   . Cancer Maternal Grandmother        cecum cancer  . Cancer Paternal Grandmother        "female"  . Colon cancer Maternal Uncle  ROS:  Pertinent items are noted in HPI.  Otherwise, a comprehensive ROS was negative.  Exam:   LMP 09/02/2004    Ht Readings from Last 3 Encounters:  09/07/18 5' 4.5" (1.638 m)  12/29/17 5' 4.5" (1.638 m)  08/13/17 5' 4.75" (1.645 m)    General appearance: alert, cooperative and appears stated age Head: Normocephalic, without obvious abnormality, atraumatic Neck: no adenopathy, supple, symmetrical, trachea midline and thyroid normal to inspection and palpation Lungs: clear to auscultation bilaterally Breasts: normal appearance, no masses or tenderness, No nipple retraction or dimpling, No nipple discharge or bleeding, No axillary or supraclavicular adenopathy Heart: regular rate and rhythm Abdomen: soft, non-tender; no masses,  no organomegaly Extremities:  extremities normal, atraumatic, no cyanosis or edema Skin: Skin color, texture, turgor normal. No rashes or lesions Lymph nodes: Cervical, supraclavicular, and axillary nodes normal. No abnormal inguinal nodes palpated Neurologic: Grossly normal   Pelvic: External genitalia:  no lesions,  Normal female              Urethra:  normal appearing urethra with no masses, tenderness or lesions              Bartholin's and Skene's: normal                 Vagina: atrophic appearing vagina with normal color and white thick discharge, no lesions              Cervix: no cervical motion tenderness, no lesions and normal appearance              Pap taken: Yes.   Bimanual Exam:  Uterus:  normal size, contour, position, consistency, mobility, non-tender and anteverted              Adnexa: normal adnexa and no mass, fullness, tenderness               Rectovaginal: Confirms               Anus:  normal sphincter tone, no lesions  Chaperone present: yes  A:  Well Woman with normal exam  Post menopausal no HRT  History of abnormal pap smear + HPV negative 16, 18,45  Persistent thrush of mouth, R/O vaginal yeast  P:   Reviewed health and wellness pertinent to exam  Aware of need to advise if vaginal bleeding  Discussed etiology of pap and HPV, will repeat today. Questions addressed.  Discussed checking for vaginal yeast, patient agreeable.   Lab: affirm  Pap smear: yes   counseled on breast self exam, mammography screening, feminine hygiene, adequate intake of calcium and vitamin D, diet and exercise  return annually or prn  An After Visit Summary was printed and given to the patient.

## 2019-01-02 NOTE — Patient Instructions (Signed)
EXERCISE AND DIET:  We recommended that you start or continue a regular exercise program for good health. Regular exercise means any activity that makes your heart beat faster and makes you sweat.  We recommend exercising at least 30 minutes per day at least 3 days a week, preferably 4 or 5.  We also recommend a diet low in fat and sugar.  Inactivity, poor dietary choices and obesity can cause diabetes, heart attack, stroke, and kidney damage, among others.   ° °ALCOHOL AND SMOKING:  Women should limit their alcohol intake to no more than 7 drinks/beers/glasses of wine (combined, not each!) per week. Moderation of alcohol intake to this level decreases your risk of breast cancer and liver damage. And of course, no recreational drugs are part of a healthy lifestyle.  And absolutely no smoking or even second hand smoke. Most people know smoking can cause heart and lung diseases, but did you know it also contributes to weakening of your bones? Aging of your skin?  Yellowing of your teeth and nails? ° °CALCIUM AND VITAMIN D:  Adequate intake of calcium and Vitamin D are recommended.  The recommendations for exact amounts of these supplements seem to change often, but generally speaking 600 mg of calcium (either carbonate or citrate) and 800 units of Vitamin D per day seems prudent. Certain women may benefit from higher intake of Vitamin D.  If you are among these women, your doctor will have told you during your visit.   ° °PAP SMEARS:  Pap smears, to check for cervical cancer or precancers,  have traditionally been done yearly, although recent scientific advances have shown that most women can have pap smears less often.  However, every woman still should have a physical exam from her gynecologist every year. It will include a breast check, inspection of the vulva and vagina to check for abnormal growths or skin changes, a visual exam of the cervix, and then an exam to evaluate the size and shape of the uterus and  ovaries.  And after 63 years of age, a rectal exam is indicated to check for rectal cancers. We will also provide age appropriate advice regarding health maintenance, like when you should have certain vaccines, screening for sexually transmitted diseases, bone density testing, colonoscopy, mammograms, etc.  ° °MAMMOGRAMS:  All women over 40 years old should have a yearly mammogram. Many facilities now offer a "3D" mammogram, which may cost around $50 extra out of pocket. If possible,  we recommend you accept the option to have the 3D mammogram performed.  It both reduces the number of women who will be called back for extra views which then turn out to be normal, and it is better than the routine mammogram at detecting truly abnormal areas.   ° °COLONOSCOPY:  Colonoscopy to screen for colon cancer is recommended for all women at age 50.  We know, you hate the idea of the prep.  We agree, BUT, having colon cancer and not knowing it is worse!!  Colon cancer so often starts as a polyp that can be seen and removed at colonscopy, which can quite literally save your life!  And if your first colonoscopy is normal and you have no family history of colon cancer, most women don't have to have it again for 10 years.  Once every ten years, you can do something that may end up saving your life, right?  We will be happy to help you get it scheduled when you are ready.    Be sure to check your insurance coverage so you understand how much it will cost.  It may be covered as a preventative service at no cost, but you should check your particular policy.   ° ° ° °Atrophic Vaginitis °Atrophic vaginitis is a condition in which the tissues that line the vagina become dry and thin. This condition occurs in women who have stopped having their period. It is caused by a drop in a female hormone (estrogen). This hormone helps: °· To keep the vagina moist. °· To make a clear fluid. This clear fluid helps: °? To make the vagina ready for  sex. °? To protect the vagina from infection. °If the lining of the vagina is dry and thin, it may cause irritation, burning, or itchiness. It may also: °· Make sex painful. °· Make an exam of your vagina painful. °· Cause bleeding. °· Make you lose interest in sex. °· Cause a burning feeling when you pee (urinate). °· Cause a brown or yellow fluid to come from your vagina. °Some women do not have symptoms. °Follow these instructions at home: °Medicines °· Take over-the-counter and prescription medicines only as told by your doctor. °· Do not use herbs or other medicines unless your doctor says it is okay. °· Use medicines for for dryness. These include: °? Oils to make the vagina soft. °? Creams. °? Moisturizers. °General instructions °· Do not douche. °· Do not use products that can make your vagina dry. These include: °? Scented sprays. °? Scented tampons. °? Scented soaps. °· Sex can help increase blood flow and soften the tissue in the vagina. If it hurts to have sex: °? Tell your partner. °? Use products to make sex more comfortable. Use these only as told by your doctor. °Contact a doctor if you: °· Have discharge from the vagina that is different than usual. °· Have a bad smell coming from your vagina. °· Have new symptoms. °· Do not get better. °· Get worse. °Summary °· Atrophic vaginitis is a condition in which the lining of the vagina becomes dry and thin. °· This condition affects women who have stopped having their periods. °· Treatment may include using products that help make the vagina soft. °· Call a doctor if do not get better with treatment. °This information is not intended to replace advice given to you by your health care provider. Make sure you discuss any questions you have with your health care provider. °Document Released: 07/08/2007 Document Revised: 02/01/2017 Document Reviewed: 02/01/2017 °Elsevier Patient Education © 2020 [employer] ° °

## 2019-01-03 LAB — VAGINITIS/VAGINOSIS, DNA PROBE
Candida Species: NEGATIVE
Gardnerella vaginalis: NEGATIVE
Trichomonas vaginosis: NEGATIVE

## 2019-01-04 ENCOUNTER — Ambulatory Visit: Payer: BLUE CROSS/BLUE SHIELD | Admitting: Certified Nurse Midwife

## 2019-01-04 LAB — CYTOLOGY - PAP
Comment: NEGATIVE
Diagnosis: NEGATIVE
High risk HPV: NEGATIVE

## 2019-01-16 ENCOUNTER — Encounter: Payer: Self-pay | Admitting: Internal Medicine

## 2019-01-16 NOTE — Telephone Encounter (Signed)
Staff to assist pt with ROV

## 2019-01-17 ENCOUNTER — Other Ambulatory Visit: Payer: Self-pay

## 2019-01-17 ENCOUNTER — Telehealth (HOSPITAL_COMMUNITY): Payer: Self-pay | Admitting: *Deleted

## 2019-01-17 ENCOUNTER — Encounter: Payer: Self-pay | Admitting: Internal Medicine

## 2019-01-17 ENCOUNTER — Ambulatory Visit (HOSPITAL_COMMUNITY)
Admission: RE | Admit: 2019-01-17 | Discharge: 2019-01-17 | Disposition: A | Discharge status: Home / Self Care | Payer: BC Managed Care – PPO | Source: Ambulatory Visit | Attending: Family | Admitting: Family

## 2019-01-17 ENCOUNTER — Ambulatory Visit (INDEPENDENT_AMBULATORY_CARE_PROVIDER_SITE_OTHER): Payer: BC Managed Care – PPO | Admitting: Internal Medicine

## 2019-01-17 VITALS — BP 138/62 | HR 86 | Temp 98.2°F | Ht 65.0 in | Wt 165.0 lb

## 2019-01-17 DIAGNOSIS — R739 Hyperglycemia, unspecified: Secondary | ICD-10-CM

## 2019-01-17 DIAGNOSIS — E785 Hyperlipidemia, unspecified: Secondary | ICD-10-CM

## 2019-01-17 DIAGNOSIS — G459 Transient cerebral ischemic attack, unspecified: Secondary | ICD-10-CM | POA: Diagnosis not present

## 2019-01-17 MED ORDER — ASPIRIN 81 MG PO TBEC: 81.0000 mg | DELAYED_RELEASE_TABLET | Freq: Every day | ORAL | 12 refills | Status: DC

## 2019-01-17 MED ORDER — ATORVASTATIN CALCIUM 20 MG PO TABS: 20.0000 mg | ORAL_TABLET | Freq: Every day | ORAL | 3 refills | Status: DC

## 2019-01-17 NOTE — Telephone Encounter (Signed)
The above patient or their representative was contacted and gave the following answers to these questions:         Do you have any of the following symptoms?    NO  Fever                    Cough                   Shortness of breath  Do  you have any of the following other symptoms?    muscle pain         vomiting,        diarrhea        rash         weakness        red eye        abdominal pain         bruising          bruising or bleeding              joint pain           severe headache    Have you been in contact with someone who was or has been sick in the past 2 weeks?  NO  Yes                 Unsure                         Unable to assess   Does the person that you were in contact with have any of the following symptoms?   Cough         shortness of breath           muscle pain         vomiting,            diarrhea            rash            weakness           fever            red eye           abdominal pain           bruising  or  bleeding                joint pain                severe headache                 COMMENTS OR ACTION PLAN FOR THIS PATIENT:   01/17/19 still no to all per pt.  [place]

## 2019-01-17 NOTE — Progress Notes (Signed)
Subjective:    Patient ID: Casey Conley, female    DOB: [birth year], 63 y.o.   MRN: [id number]  HPI    Here to f/u with c/o neuro symptoms - starting Sat 4 pm while driving, and in mirror saw her left lower face drooping, with slight HA, might have had some tingling, husband noticed when she got home, then all resolved after 1 hour. Has not recurred.  Never had dysarthria, dysphagia. Pt has not been taking asa or anticoagulant.  Did take take tyelnol when got home, and takes alleve regualrly for arthritic pain and ritht shoulder impingement issue.  Pt refused ED then and now.  Has known HLD and is ok with starting statin now. Pt denies chest pain, increased sob or doe, wheezing, orthopnea, PND, increased LE swelling, palpitations, dizziness or syncope.  Pt denies new neurological symptoms such as new headache, or facial or extremity weakness or numbness except for the above.   Pt denies polydipsia, polyuria.   Past Medical History:  Diagnosis Date  . Abnormal Pap smear of cervix    12-29-17 neg HPV HR+ 16,18/45 neg  . ANEMIA-IRON DEFICIENCY 10/29/2008  . Dysuria 03/29/2009  . Fibroid, uterine   . History of fibrocystic disease of breast   . OSTEOPOROSIS 10/29/2008  . Osteoporosis    on Bisphosphonate for aprox 2007  . Proteinuria    24 hour-normal   Past Surgical History:  Procedure Laterality Date  . BREAST LUMPECTOMY  approx 1990   benign-left  . TONGUE SURGERY     had growth removed  . UTERINE FIBROID EMBOLIZATION     s/p    reports that she has never smoked. She has never used smokeless tobacco. She reports that she does not drink alcohol or use drugs. family history includes Arthritis in her mother; Breast cancer in her mother; Cancer in her father, maternal grandmother, mother, and paternal grandmother; Colon cancer in her father and maternal uncle; Dementia in her mother; Diabetes in her father and paternal grandfather; Heart disease in her father and paternal grandfather;  Hyperlipidemia in her father; Osteoporosis in her mother; Transient ischemic attack in her mother. No Known Allergies Current Outpatient Medications on File Prior to Visit  Medication Sig Dispense Refill  . CALCIUM PO Take 1,000 mg by mouth.    . denosumab (PROLIA) 60 MG/ML SOSY injection Inject 60 mg into the skin every 6 (six) months.    . Doxylamine Succinate, Sleep, (SLEEP AID PO) Take by mouth at bedtime.    Marland Kitchen ibuprofen (ADVIL) 400 MG tablet     . VITAMIN D-VITAMIN K PO Take by mouth. Vitamin d 5000     No current facility-administered medications on file prior to visit.   Review of Systems  Constitutional: Negative for other unusual diaphoresis or sweats HENT: Negative for ear discharge or swelling Eyes: Negative for other worsening visual disturbances Respiratory: Negative for stridor or other swelling  Gastrointestinal: Negative for worsening distension or other blood Genitourinary: Negative for retention or other urinary change Musculoskeletal: Negative for other MSK pain or swelling Skin: Negative for color change or other new lesions Neurological: Negative for worsening tremors and other numbness  Psychiatric/Behavioral: Negative for worsening agitation or other fatigue All otherwise neg per pt     Objective:   Physical Exam BP 138/62 (BP Location: Left Arm, Patient Position: Sitting, Cuff Size: Normal)   Pulse 86   Temp 98.2 F (36.8 C) (Oral)   Ht 5\' 5"  (1.651 m)   Wt  165 lb (74.8 kg)   LMP 09/02/2004   SpO2 97%   BMI 27.46 kg/m  VS noted,  Constitutional: Pt appears in NAD HENT: Head: NCAT.  Right Ear: External ear normal.  Left Ear: External ear normal.  Eyes: . Pupils are equal, round, and reactive to light. Conjunctivae and EOM are normal Nose: without d/c or deformity Neck: Neck supple. Gross normal ROM Cardiovascular: Normal rate and regular rhythm.   Pulmonary/Chest: Effort normal and breath sounds without rales or wheezing.  Abd:  Soft, NT, ND, +  BS, no organomegaly Neurological: Pt is alert. At baseline orientation, motor grossly intact Skin: Skin is warm. No rashes, other new lesions, no LE edema Psychiatric: Pt behavior is normal without agitation , nervous All otherwise neg per pt   ECG which I personally interpreted: NSR    Assessment & Plan:

## 2019-01-17 NOTE — Patient Instructions (Addendum)
Your EKG was OK today  Please take all new medication as prescribed - the Ecotrin 81 mg per day (aspirin), and generic lipitor 20 mg per day  You will be contacted regarding the referral for: Head MRI, Carotid artery ultrasound, and Echocardiogram (to see PCCs now)  Please continue all other medications as before, and refills have been done if requested.  Please have the pharmacy call with any other refills you may need.  Please continue your efforts at being more active, low cholesterol diet, and weight control.  Please keep your appointments with your specialists as you may have planned  Please return in 3 months, or sooner if needed

## 2019-01-18 DIAGNOSIS — M810 Age-related osteoporosis without current pathological fracture: Secondary | ICD-10-CM | POA: Diagnosis not present

## 2019-01-19 ENCOUNTER — Ambulatory Visit (HOSPITAL_COMMUNITY): Payer: BC Managed Care – PPO | Attending: Cardiology

## 2019-01-19 ENCOUNTER — Other Ambulatory Visit: Payer: Self-pay

## 2019-01-19 DIAGNOSIS — Z135 Encounter for screening for eye and ear disorders: Secondary | ICD-10-CM | POA: Diagnosis not present

## 2019-01-19 DIAGNOSIS — G459 Transient cerebral ischemic attack, unspecified: Secondary | ICD-10-CM | POA: Diagnosis not present

## 2019-01-19 DIAGNOSIS — Z8673 Personal history of transient ischemic attack (TIA), and cerebral infarction without residual deficits: Secondary | ICD-10-CM | POA: Diagnosis not present

## 2019-01-19 DIAGNOSIS — R202 Paresthesia of skin: Secondary | ICD-10-CM | POA: Diagnosis not present

## 2019-01-21 ENCOUNTER — Encounter: Payer: Self-pay | Admitting: Internal Medicine

## 2019-01-21 DIAGNOSIS — E785 Hyperlipidemia, unspecified: Secondary | ICD-10-CM | POA: Insufficient documentation

## 2019-01-21 NOTE — Assessment & Plan Note (Addendum)
Exam benign today, symptoms resolved in 1 hr without recurrence, ecg reviewed,  for ecotrin 81 mg start daily, head MRI, carotids, Echo, and declines neurology today  Note:  Total time for pt hx, exam, review of record with pt in the room, determination of diagnoses and plan for further eval and tx is > 40 min, with over 50% spent in coordination and counseling of patient including the differential dx, tx, further evaluation and other management of TIA, hyperglycemia, and HLD

## 2019-01-21 NOTE — Assessment & Plan Note (Signed)
stable overall by history and exam, recent data reviewed with pt, and pt to continue medical treatment as before,  to f/u any worsening symptoms or concerns  

## 2019-01-21 NOTE — Assessment & Plan Note (Signed)
Ok fro lipitor 20 qd, f/u lipids next visit

## 2019-01-30 ENCOUNTER — Encounter: Payer: Self-pay | Admitting: Internal Medicine

## 2019-02-04 ENCOUNTER — Other Ambulatory Visit: Payer: BC Managed Care – PPO

## 2019-02-16 DIAGNOSIS — Z1231 Encounter for screening mammogram for malignant neoplasm of breast: Secondary | ICD-10-CM | POA: Diagnosis not present

## 2019-02-22 ENCOUNTER — Encounter: Payer: Self-pay | Admitting: Internal Medicine

## 2019-02-23 ENCOUNTER — Other Ambulatory Visit: Payer: Self-pay

## 2019-02-23 ENCOUNTER — Ambulatory Visit (INDEPENDENT_AMBULATORY_CARE_PROVIDER_SITE_OTHER): Payer: BC Managed Care – PPO | Admitting: Internal Medicine

## 2019-02-23 ENCOUNTER — Encounter: Payer: Self-pay | Admitting: Internal Medicine

## 2019-02-23 VITALS — BP 158/82 | HR 90 | Temp 98.6°F | Ht 65.0 in | Wt 171.0 lb

## 2019-02-23 DIAGNOSIS — G459 Transient cerebral ischemic attack, unspecified: Secondary | ICD-10-CM | POA: Diagnosis not present

## 2019-02-23 DIAGNOSIS — E785 Hyperlipidemia, unspecified: Secondary | ICD-10-CM

## 2019-02-23 DIAGNOSIS — R030 Elevated blood-pressure reading, without diagnosis of hypertension: Secondary | ICD-10-CM

## 2019-02-23 DIAGNOSIS — R739 Hyperglycemia, unspecified: Secondary | ICD-10-CM | POA: Diagnosis not present

## 2019-02-23 MED ORDER — ASPIRIN EC 325 MG PO TBEC: 325.0000 mg | DELAYED_RELEASE_TABLET | Freq: Every day | ORAL | 99 refills | Status: DC

## 2019-02-23 NOTE — Patient Instructions (Signed)
Ok to increase the aspirin to 325 mg per day  You will be contacted regarding the referral for: Neurology - [facility]  Continue to monitor your blood pressure at home, with the goal of less than 140/90  Please continue all other medications as before, and refills have been done if requested.  Please have the pharmacy call with any other refills you may need.  Please continue your efforts at being more active, low cholesterol diet, and weight control.  Please keep your appointments with your specialists as you may have planned

## 2019-02-23 NOTE — Progress Notes (Addendum)
Subjective:    Patient ID: Casey Conley, female    DOB: 01/22/1956, 64 y.o.   MRN: [id number]  HPI   Here to fu after second episode per pt with left facial/left lower lip drooping with HA, lasted most the day a few days ago, again noted by another person, though she admits to ongoing marked anxiety as well with stressors.  Also with ongoing right shoulder pain x 3 mo where cortisone given but no help then tx with prednisone where she has been wary of starting.  BP remaiins mild elevated today. Pt denies chest pain, increased sob or doe, wheezing, orthopnea, PND, increased LE swelling, palpitations, dizziness or syncope.   Pt denies polydipsia, polyuria Recent MRI neg for acute.  Pt asks for [facility] neurology referral as she believes they are "more specialized" than local MD that "see everything". Past Medical History:  Diagnosis Date  . Abnormal Pap smear of cervix    12-29-17 neg HPV HR+ 16,18/45 neg  . ANEMIA-IRON DEFICIENCY 10/29/2008  . Dysuria 03/29/2009  . Fibroid, uterine   . History of fibrocystic disease of breast   . OSTEOPOROSIS 10/29/2008  . Osteoporosis    on Bisphosphonate for aprox 2007  . Proteinuria    24 hour-normal   Past Surgical History:  Procedure Laterality Date  . BREAST LUMPECTOMY  approx 1990   benign-left  . TONGUE SURGERY     had growth removed  . UTERINE FIBROID EMBOLIZATION     s/p    reports that she has never smoked. She has never used smokeless tobacco. She reports that she does not drink alcohol or use drugs. family history includes Arthritis in her mother; Breast cancer in her mother; Cancer in her father, maternal grandmother, mother, and paternal grandmother; Colon cancer in her father and maternal uncle; Dementia in her mother; Diabetes in her father and paternal grandfather; Heart disease in her father and paternal grandfather; Hyperlipidemia in her father; Osteoporosis in her mother; Transient ischemic attack in her mother. No Known Allergies Current  Outpatient Medications on File Prior to Visit  Medication Sig Dispense Refill  . atorvastatin (LIPITOR) 20 MG tablet Take 1 tablet (20 mg total) by mouth daily. 90 tablet 3  . CALCIUM PO Take 1,000 mg by mouth.    . denosumab (PROLIA) 60 MG/ML SOSY injection Inject 60 mg into the skin every 6 (six) months.    . Doxylamine Succinate, Sleep, (SLEEP AID PO) Take by mouth at bedtime.    Marland Kitchen ibuprofen (ADVIL) 400 MG tablet     . VITAMIN D-VITAMIN K PO Take by mouth. Vitamin d 5000     No current facility-administered medications on file prior to visit.   Review of Systems All otherwise neg per pt     Objective:   Physical Exam BP (!) 158/82   Pulse 90   Temp 98.6 F (37 C)   Ht 5\' 5"  (1.651 m)   Wt 171 lb (77.6 kg)   LMP 09/02/2004   SpO2 99%   BMI 28.46 kg/m  VS noted,  Constitutional: Pt appears in NAD HENT: Head: NCAT.  Right Ear: External ear normal.  Left Ear: External ear normal.  Eyes: . Pupils are equal, round, and reactive to light. Conjunctivae and EOM are normal Nose: without d/c or deformity Neck: Neck supple. Gross normal ROM Cardiovascular: Normal rate and regular rhythm.   Pulmonary/Chest: Effort normal and breath sounds without rales or wheezing.  Abd:  Soft, NT, ND, + BS, no  organomegaly Neurological: Pt is alert. At baseline orientation, motor grossly intact Skin: Skin is warm. No rashes, other new lesions, no LE edema Psychiatric: Pt behavior is normal without agitation , 3+ anxiety Lab Results  Component Value Date   WBC 7.8 09/07/2018   HGB 15.0 09/07/2018   HCT 45.8 09/07/2018   PLT 219.0 09/07/2018   GLUCOSE 94 09/07/2018   CHOL 225 (H) 09/07/2018   TRIG 114.0 09/07/2018   HDL 98.60 09/07/2018   LDLDIRECT 103.6 06/01/2012   LDLCALC 104 (H) 09/07/2018   ALT 31 09/07/2018   AST 25 09/07/2018   NA 139 09/07/2018   K 4.1 09/07/2018   CL 105 09/07/2018   CREATININE 0.74 09/07/2018   BUN 15 09/07/2018   CO2 25 09/07/2018   TSH 3.77 09/07/2018    HGBA1C 6.1 09/07/2018          Assessment & Plan:

## 2019-02-24 ENCOUNTER — Encounter: Payer: Self-pay | Admitting: Internal Medicine

## 2019-02-24 MED ORDER — ASPIRIN EC 325 MG PO TBEC: 325.0000 mg | DELAYED_RELEASE_TABLET | Freq: Every day | ORAL | 3 refills | Status: DC

## 2019-02-26 ENCOUNTER — Encounter: Payer: Self-pay | Admitting: Internal Medicine

## 2019-02-26 NOTE — Assessment & Plan Note (Signed)
Pt declines med tx for now,  to f/u any worsening symptoms or concerns

## 2019-02-26 NOTE — Assessment & Plan Note (Signed)
stable overall by history and exam, recent data reviewed with pt, and pt to continue medical treatment as before,  to f/u any worsening symptoms or concerns  

## 2019-02-26 NOTE — Assessment & Plan Note (Addendum)
With possible recurrence, for increased asa 325 qd, refer [facility] neurology per pt reqeust  I spent 41 minutes preparing to see the patient by review of recent labs, imaging and procedures, obtaining and reviewing separately obtained history, communicating with the patient and family or caregiver, ordering medications, tests or procedures, and documenting clinical information in the EHR including the differential Dx, treatment, and any further evaluation and other management of TIA, hyperglycemia, HLD, elevated BP

## 2019-03-12 ENCOUNTER — Encounter: Payer: Self-pay | Admitting: Internal Medicine

## 2019-03-16 ENCOUNTER — Encounter: Payer: Self-pay | Admitting: Internal Medicine

## 2019-03-16 MED ORDER — NYSTATIN 100000 UNIT/ML MT SUSP: 500000.0000 [IU] | Freq: Four times a day (QID) | OROMUCOSAL | 0 refills | Status: AC

## 2019-03-16 NOTE — Telephone Encounter (Signed)
Ok nystatin soln - done erx

## 2019-03-21 DIAGNOSIS — Z79899 Other long term (current) drug therapy: Secondary | ICD-10-CM | POA: Diagnosis not present

## 2019-03-21 DIAGNOSIS — R29810 Facial weakness: Secondary | ICD-10-CM | POA: Diagnosis not present

## 2019-03-21 DIAGNOSIS — Z7982 Long term (current) use of aspirin: Secondary | ICD-10-CM | POA: Diagnosis not present

## 2019-03-21 DIAGNOSIS — G459 Transient cerebral ischemic attack, unspecified: Secondary | ICD-10-CM | POA: Diagnosis not present

## 2019-04-12 ENCOUNTER — Telehealth: Payer: Self-pay | Admitting: Internal Medicine

## 2019-04-12 NOTE — Telephone Encounter (Signed)
Ok to change next appt to Aug 2021 for cpx

## 2019-04-12 NOTE — Telephone Encounter (Signed)
New message:   Pt is calling and states she would like to know if she should still keep her appt for 04/17/19 since she was last here on 02/23/19. She states she will be happy to come in if need be but if not she would like to save money if the visit is unnecessary.

## 2019-04-13 NOTE — Telephone Encounter (Signed)
Message sent to patient today.

## 2019-04-17 ENCOUNTER — Ambulatory Visit: Payer: BC Managed Care – PPO | Admitting: Internal Medicine

## 2019-04-25 ENCOUNTER — Encounter: Payer: Self-pay | Admitting: Certified Nurse Midwife

## 2019-06-26 DIAGNOSIS — H02815 Retained foreign body in left lower eyelid: Secondary | ICD-10-CM | POA: Diagnosis not present

## 2019-07-25 DIAGNOSIS — M810 Age-related osteoporosis without current pathological fracture: Secondary | ICD-10-CM | POA: Diagnosis not present

## 2019-07-25 DIAGNOSIS — Z79899 Other long term (current) drug therapy: Secondary | ICD-10-CM | POA: Diagnosis not present

## 2019-07-25 DIAGNOSIS — Z5181 Encounter for therapeutic drug level monitoring: Secondary | ICD-10-CM | POA: Diagnosis not present

## 2019-11-07 DIAGNOSIS — L728 Other follicular cysts of the skin and subcutaneous tissue: Secondary | ICD-10-CM | POA: Diagnosis not present

## 2019-11-07 DIAGNOSIS — Z1283 Encounter for screening for malignant neoplasm of skin: Secondary | ICD-10-CM | POA: Diagnosis not present

## 2019-11-07 DIAGNOSIS — L738 Other specified follicular disorders: Secondary | ICD-10-CM | POA: Diagnosis not present

## 2019-11-07 DIAGNOSIS — D225 Melanocytic nevi of trunk: Secondary | ICD-10-CM | POA: Diagnosis not present

## 2019-12-02 ENCOUNTER — Ambulatory Visit: Payer: BC Managed Care – PPO

## 2019-12-23 ENCOUNTER — Other Ambulatory Visit: Payer: Self-pay | Admitting: Internal Medicine

## 2019-12-23 NOTE — Telephone Encounter (Signed)
Please refill as per office routine med refill policy (all routine meds refilled for 3 mo or monthly per pt preference up to one year from last visit, then month to month grace period for 3 mo, then further med refills will have to be denied)  

## 2020-01-03 ENCOUNTER — Ambulatory Visit: Payer: BC Managed Care – PPO | Admitting: Certified Nurse Midwife

## 2020-02-01 DIAGNOSIS — Z5181 Encounter for therapeutic drug level monitoring: Secondary | ICD-10-CM | POA: Diagnosis not present

## 2020-02-01 DIAGNOSIS — M810 Age-related osteoporosis without current pathological fracture: Secondary | ICD-10-CM | POA: Diagnosis not present

## 2020-02-01 DIAGNOSIS — Z79899 Other long term (current) drug therapy: Secondary | ICD-10-CM | POA: Diagnosis not present

## 2020-02-12 ENCOUNTER — Ambulatory Visit: Payer: BC Managed Care – PPO | Admitting: Obstetrics & Gynecology

## 2020-02-17 ENCOUNTER — Encounter: Payer: Self-pay | Admitting: Obstetrics and Gynecology

## 2020-02-17 DIAGNOSIS — Z1231 Encounter for screening mammogram for malignant neoplasm of breast: Secondary | ICD-10-CM | POA: Diagnosis not present

## 2020-02-28 ENCOUNTER — Telehealth: Payer: Self-pay | Admitting: Obstetrics and Gynecology

## 2020-02-28 DIAGNOSIS — Z9189 Other specified personal risk factors, not elsewhere classified: Secondary | ICD-10-CM

## 2020-02-28 NOTE — Telephone Encounter (Signed)
Please let the patient know that I have reviewed her mammogram from [place]. They did the [employer] BCRA tool on her and estimate her lifetime risk of breast cancer at 26.4%. If the risk is 20% or higher yearly breast MRI's are recommended. They are typically done 6 months after the mammogram. Please offer her the MRI as well as a referral to the genetic center, they would talk to her about possible genetic testing.

## 2020-03-01 ENCOUNTER — Telehealth: Payer: Self-pay | Admitting: Genetic Counselor

## 2020-03-01 NOTE — Telephone Encounter (Signed)
Received a genetic counseling referral from Dr. Jertson for increased risk for brca. Pt has been cld and scheduled to see Cari on 2/3 at 1pm. Pt preferred an in person visit. Aware to arrive 15 minutes early. 

## 2020-03-04 ENCOUNTER — Telehealth: Payer: Self-pay | Admitting: Internal Medicine

## 2020-03-04 DIAGNOSIS — E785 Hyperlipidemia, unspecified: Secondary | ICD-10-CM

## 2020-03-04 DIAGNOSIS — R739 Hyperglycemia, unspecified: Secondary | ICD-10-CM

## 2020-03-04 NOTE — Telephone Encounter (Signed)
Patient called and is scheduled for a physical on 2.8.22 and was wondering if she could have lab orders placed so she could go and have those done before her appointment. She has [employer].

## 2020-03-04 NOTE — Telephone Encounter (Signed)
Ok labs done 

## 2020-03-05 ENCOUNTER — Other Ambulatory Visit (INDEPENDENT_AMBULATORY_CARE_PROVIDER_SITE_OTHER): Payer: BC Managed Care – PPO

## 2020-03-05 DIAGNOSIS — E785 Hyperlipidemia, unspecified: Secondary | ICD-10-CM

## 2020-03-05 DIAGNOSIS — R739 Hyperglycemia, unspecified: Secondary | ICD-10-CM | POA: Diagnosis not present

## 2020-03-05 LAB — URINALYSIS, ROUTINE W REFLEX MICROSCOPIC
Bilirubin Urine: NEGATIVE
Hgb urine dipstick: NEGATIVE
Ketones, ur: NEGATIVE
Leukocytes,Ua: NEGATIVE
Nitrite: NEGATIVE
Specific Gravity, Urine: 1.015 (ref 1.000–1.030)
Total Protein, Urine: NEGATIVE
Urine Glucose: NEGATIVE
Urobilinogen, UA: 0.2 (ref 0.0–1.0)
pH: 7.5 (ref 5.0–8.0)

## 2020-03-05 LAB — BASIC METABOLIC PANEL
BUN: 18 mg/dL (ref 6–23)
CO2: 31 mEq/L (ref 19–32)
Calcium: 10 mg/dL (ref 8.4–10.5)
Chloride: 103 mEq/L (ref 96–112)
Creatinine, Ser: 0.79 mg/dL (ref 0.40–1.20)
GFR: 78.92 mL/min (ref >60.00)
Glucose, Bld: 85 mg/dL (ref 70–99)
Potassium: 4.1 mEq/L (ref 3.5–5.1)
Sodium: 139 mEq/L (ref 135–145)

## 2020-03-05 LAB — HEPATIC FUNCTION PANEL
ALT: 43 U/L — ABNORMAL HIGH (ref 0–35)
AST: 31 U/L (ref 0–37)
Albumin: 4.4 g/dL (ref 3.5–5.2)
Alkaline Phosphatase: 92 U/L (ref 39–117)
Bilirubin, Direct: 0.1 mg/dL (ref 0.0–0.3)
Total Bilirubin: 0.5 mg/dL (ref 0.2–1.2)
Total Protein: 7.1 g/dL (ref 6.0–8.3)

## 2020-03-05 LAB — CBC WITH DIFFERENTIAL/PLATELET
Basophils Absolute: 0 10*3/uL (ref 0.0–0.1)
Basophils Relative: 0.5 % (ref 0.0–3.0)
Eosinophils Absolute: 0.1 10*3/uL (ref 0.0–0.7)
Eosinophils Relative: 2.4 % (ref 0.0–5.0)
HCT: 43.5 % (ref 36.0–46.0)
Hemoglobin: 14.7 g/dL (ref 12.0–15.0)
Lymphocytes Relative: 33.1 % (ref 12.0–46.0)
Lymphs Abs: 2.1 10*3/uL (ref 0.7–4.0)
MCHC: 33.8 g/dL (ref 30.0–36.0)
MCV: 87.6 fl (ref 78.0–100.0)
Monocytes Absolute: 0.6 10*3/uL (ref 0.1–1.0)
Monocytes Relative: 8.9 % (ref 3.0–12.0)
Neutro Abs: 3.4 10*3/uL (ref 1.4–7.7)
Neutrophils Relative %: 55.1 % (ref 43.0–77.0)
Platelets: 223 10*3/uL (ref 150.0–400.0)
RBC: 4.96 Mil/uL (ref 3.87–5.11)
RDW: 13.9 % (ref 11.5–15.5)
WBC: 6.2 10*3/uL (ref 4.0–10.5)

## 2020-03-05 LAB — HEMOGLOBIN A1C: Hgb A1c MFr Bld: 6.2 % (ref 4.6–6.5)

## 2020-03-05 LAB — LIPID PANEL
Cholesterol: 184 mg/dL (ref 0–200)
HDL: 93.2 mg/dL (ref >39.00)
LDL Cholesterol: 70 mg/dL (ref 0–99)
NonHDL: 90.37
Total CHOL/HDL Ratio: 2
Triglycerides: 103 mg/dL (ref 0.0–149.0)
VLDL: 20.6 mg/dL (ref 0.0–40.0)

## 2020-03-05 LAB — TSH: TSH: 3.18 u[IU]/mL (ref 0.35–4.50)

## 2020-03-05 NOTE — Telephone Encounter (Signed)
Patient scheduled on 03/07/20 with Genetic Counselor office.

## 2020-03-07 ENCOUNTER — Other Ambulatory Visit: Payer: BC Managed Care – PPO

## 2020-03-07 ENCOUNTER — Other Ambulatory Visit: Payer: Self-pay

## 2020-03-07 ENCOUNTER — Inpatient Hospital Stay: Payer: BC Managed Care – PPO | Attending: Internal Medicine | Admitting: Genetic Counselor

## 2020-03-07 DIAGNOSIS — Z803 Family history of malignant neoplasm of breast: Secondary | ICD-10-CM | POA: Diagnosis not present

## 2020-03-07 DIAGNOSIS — Z800 Family history of malignant neoplasm of digestive organs: Secondary | ICD-10-CM

## 2020-03-07 NOTE — Progress Notes (Signed)
REFERRING PROVIDER: Salvadore Dom, [address],  [place] [address]  PRIMARY PROVIDER:  Biagio Borg, MD  PRIMARY REASON FOR VISIT:  . Family history of breast cancer  . Family history of colon cancer    HISTORY OF PRESENT ILLNESS:   Casey Conley, a 65 y.o. female, was seen for a [place] cancer genetics consultation at the request of Dr. Nelson Chimes due to a family history of cancer.  Casey Conley presents to clinic today to discuss the possibility of a hereditary predisposition to cancer, to discuss genetic testing, and to further clarify her future cancer risks, as well as potential cancer risks for family members.    Casey Conley is a 65 y.o. female with no personal history of cancer.    RISK FACTORS:  Menarche was at age 33 or 68.  Nulliparous.  OCP use for approximately 0 years.  Ovaries intact: yes.  Hysterectomy: no.  Menopausal status: postmenopausal.  HRT use: 0 years. Colonoscopy: yes; most recent approximately 4 years ago; scheudled to follow-up after 5 years. Mammogram within the last year: yes. Number of breast biopsies: in late 20s; benign per patient. Up to date with pelvic exams: most recent PAP 01/2019. Any excessive radiation exposure in the past: none reported  Past Medical History:  Diagnosis Date  . Abnormal Pap smear of cervix    12-29-17 neg HPV HR+ 16,18/45 neg  . ANEMIA-IRON DEFICIENCY 10/29/2008  . Dysuria 03/29/2009  . Fibroid, uterine   . History of fibrocystic disease of breast   . OSTEOPOROSIS 10/29/2008  . Osteoporosis    on Bisphosphonate for aprox 2007  . Proteinuria    24 hour-normal    Past Surgical History:  Procedure Laterality Date  . BREAST LUMPECTOMY  approx 1990   benign-left  . TONGUE SURGERY     had growth removed  . UTERINE FIBROID EMBOLIZATION     s/p    Social History   Socioeconomic History  . Marital status: Married    Spouse name: Not on file  . Number of children: 0  . Years of education:  Not on file  . Highest education level: Not on file  Occupational History  . Occupation: Proofreader  Tobacco Use  . Smoking status: Never Smoker  . Smokeless tobacco: Never Used  Substance and Sexual Activity  . Alcohol use: No    Alcohol/week: 0.0 standard drinks  . Drug use: No  . Sexual activity: Not Currently    Partners: Male    Birth control/protection: Post-menopausal  Other Topics Concern  . Not on file  Social History Narrative   No siblings    Social Determinants of Health   Financial Resource Strain: Not on file  Food Insecurity: Not on file  Transportation Needs: Not on file  Physical Activity: Not on file  Stress: Not on file  Social Connections: Not on file     FAMILY HISTORY:  We obtained a detailed, 4-generation family history.  Significant diagnoses are listed below: Family History  Problem Relation Age of Onset  . Breast cancer Mother        dx 32s, dx 60s  . Colon cancer Father 58  . Lung cancer Father 97  . Cancer Maternal Grandmother 84       unknown type; ? kidney cancer  . Uterine cancer Paternal Grandmother        or other "female cancer", dx 60s-70s  . Cancer Maternal Uncle  unknown type; ? colon cancer; dx 90s  . Cancer Paternal Uncle        sacral chordoma, d. 48  . Colon polyps Maternal Uncle        unknown number    Casey Conley does not have children or siblings.    Her mother was diagnosed with breast cancer in her 72s and again in her 70s.  Casey Conley maternal uncle had colon polyps (unknown number).  Her other maternal uncle had an unknown cancer, possibly colon cancer, diagnosed in his 45s.  Casey Conley maternal grandmother had an unknown type of cancer, possibly kidney cancer, diagnosed at age 27.  No other maternal family history of cancer was reported.   Casey Conley father died at age 45 and had a history of lung cancer and colon cancer at age 50.  Casey Conley paternal uncle had a history of sacral  chordroma. Casey Conley paternal grandmother had a "female cancer", most likely uterine cancer, diagnosed in her 72s or 33s. No other paternal family history of cancer was reported.   Casey Conley is unaware of previous family history of genetic testing for hereditary cancer risks. Patient's maternal ancestors are of [place] and [place] descent, and paternal ancestors are of [place], [place], and [place] descent. There reported maternal and paternal Jewish ancestry. There is no known consanguinity.  GENETIC COUNSELING ASSESSMENT: Casey Conley is a 65 y.o. female with a family history of cancer which is somewhat suggestive of a hereditary cancer syndrome and predisposition to cancer given her family history of breast cancer and Jewish ancestry. We, therefore, discussed and recommended the following at today's visit.   DISCUSSION: We discussed that 5 - 10% of cancer is hereditary, with most cases of hereditary breast cancer associated with mutations in BRCA1/2.  There are other genes that can be associated with hereditary breast, colon, or uterine cancer syndromes.  Type of cancer risk and level of risk are gene-specific.  We discussed that testing is beneficial for several reasons, including knowing about other cancer risks, identifying potential screening and risk-reduction options that may be appropriate, and to understanding if other family members could be at risk for cancer and allowing them to undergo genetic testing.  We reviewed the characteristics, features and inheritance patterns of hereditary cancer syndromes. We also discussed genetic testing, including the appropriate family members to test, the process of testing, insurance coverage and turn-around-time for results. We discussed the implications of a negative, positive, carrier and/or variant of uncertain significant result. We discussed that negative results would be uninformative given that Casey Conley does not have a personal history of cancer. We  recommended Casey Conley pursue genetic testing for a panel that contains genes associated with breast, colon, and endometrial cancers, such as the [facility] Common Hereditary Cancers Panel.   The Common Hereditary Cancers Panel offered by [facility] includes sequencing and/or deletion duplication testing of the following 48 genes: APC, ATM, AXIN2, BARD1, BMPR1A, BRCA1, BRCA2, BRIP1, CDH1, CDK4, CDKN2A (p14ARF), CDKN2A (p16INK4a), CHEK2, CTNNA1, DICER1, EPCAM (Deletion/duplication testing only), GREM1 (promoter region deletion/duplication testing only), KIT, MEN1, MLH1, MSH2, MSH3, MSH6, MUTYH, NBN, NF1, NHTL1, PALB2, PDGFRA, PMS2, POLD1, POLE, PTEN, RAD50, RAD51C, RAD51D, RNF43, SDHB, SDHC, SDHD, SMAD4, SMARCA4. STK11, TP53, TSC1, TSC2, and VHL.  The following genes were evaluated for sequence changes only: SDHA and HOXB13 c.251G>A variant only.  Based on Casey Conley's maternal family history of breast cancer and Jewish ancestry, she meets medical criteria for genetic testing. Despite that she meets criteria, she  may still have an out of pocket cost.   We discussed that some people do not want to undergo genetic testing due to fear of genetic discrimination.  A federal law called the Genetic Information Non-Discrimination Act (GINA) of 2008 helps protect individuals against genetic discrimination based on their genetic test results.  It impacts both health insurance and employment.  With health insurance, it protects against increased premiums, being kicked off insurance or being forced to take a test in order to be insured.  For employment it protects against hiring, firing and promoting decisions based on genetic test results.  GINA does not apply to those in the [employer], those who work for companies with less than 15 employees, and new life insurance or long-term disability insurance policies.  Health status due to a cancer diagnosis is not protected under GINA.  Based on the patient's family history, a statistical  model [employer]) was used to estimate her risk of developing breast cancer. This estimates her lifetime risk of developing breast cancer to be approximately 22%. This estimation does not consider any genetic testing results.  The patient's lifetime breast cancer risk is a preliminary estimate based on available information using one of several models endorsed by the [place] (ACS). The ACS recommends consideration of breast MRI screening as an adjunct to mammography for patients at high risk (defined as 20% or greater lifetime risk).  Casey Conley plans to discuss this recommendation further with her physicians.           PLAN: Despite our recommendation, Casey Conley did not wish to pursue genetic testing at today's visit. She wishes to take more time to think about the benefits and limitations of genetic testing in her personal situation. We understand this decision and remain available to coordinate genetic testing at any time in the future. We, therefore, recommend Casey Conley continue to follow the cancer screening guidelines given by her primary healthcare provider.  Lastly, we encouraged Casey Conley to remain in contact with cancer genetics annually so that we can continuously update the family history and inform her of any changes in cancer genetics and testing that may be of benefit for this family.   Casey Conley questions were answered to her satisfaction today. Our contact information was provided should additional questions or concerns arise. Thank you for the referral and allowing [place] to share in the care of your patient.   Cari M. Joette Catching, [place], [facility] Genetic Counselor [email or web address] (P) [phone]   The patient was seen for a total of 40 minutes in face-to-face genetic counseling.  Drs. Magrinat, Lindi Adie and/or Burr Medico were available to discuss this case as needed.  _______________________________________________________________________ For Office Staff:  Number of  people involved in session: 1 Was an Intern/ student involved with case: no

## 2020-03-11 ENCOUNTER — Encounter: Payer: Self-pay | Admitting: Genetic Counselor

## 2020-03-11 ENCOUNTER — Other Ambulatory Visit: Payer: Self-pay

## 2020-03-11 DIAGNOSIS — Z803 Family history of malignant neoplasm of breast: Secondary | ICD-10-CM

## 2020-03-11 DIAGNOSIS — Z800 Family history of malignant neoplasm of digestive organs: Secondary | ICD-10-CM

## 2020-03-11 DIAGNOSIS — Z1379 Encounter for other screening for genetic and chromosomal anomalies: Secondary | ICD-10-CM | POA: Insufficient documentation

## 2020-03-11 HISTORY — DX: Family history of malignant neoplasm of breast: Z80.3

## 2020-03-11 HISTORY — DX: Family history of malignant neoplasm of digestive organs: Z80.0

## 2020-03-12 ENCOUNTER — Encounter: Payer: Self-pay | Admitting: Internal Medicine

## 2020-03-12 ENCOUNTER — Ambulatory Visit (INDEPENDENT_AMBULATORY_CARE_PROVIDER_SITE_OTHER): Payer: BC Managed Care – PPO | Admitting: Internal Medicine

## 2020-03-12 VITALS — BP 120/76 | HR 76 | Temp 98.2°F | Ht 65.0 in | Wt 168.0 lb

## 2020-03-12 DIAGNOSIS — Z0000 Encounter for general adult medical examination without abnormal findings: Secondary | ICD-10-CM | POA: Diagnosis not present

## 2020-03-12 DIAGNOSIS — G459 Transient cerebral ischemic attack, unspecified: Secondary | ICD-10-CM

## 2020-03-12 DIAGNOSIS — E611 Iron deficiency: Secondary | ICD-10-CM | POA: Diagnosis not present

## 2020-03-12 DIAGNOSIS — E785 Hyperlipidemia, unspecified: Secondary | ICD-10-CM

## 2020-03-12 DIAGNOSIS — Z0001 Encounter for general adult medical examination with abnormal findings: Secondary | ICD-10-CM

## 2020-03-12 DIAGNOSIS — E559 Vitamin D deficiency, unspecified: Secondary | ICD-10-CM

## 2020-03-12 DIAGNOSIS — E538 Deficiency of other specified B group vitamins: Secondary | ICD-10-CM | POA: Diagnosis not present

## 2020-03-12 DIAGNOSIS — R030 Elevated blood-pressure reading, without diagnosis of hypertension: Secondary | ICD-10-CM

## 2020-03-12 DIAGNOSIS — R739 Hyperglycemia, unspecified: Secondary | ICD-10-CM

## 2020-03-12 MED ORDER — ASPIRIN 81 MG PO TBEC: 81.0000 mg | DELAYED_RELEASE_TABLET | Freq: Every day | ORAL | 12 refills | Status: DC

## 2020-03-12 MED ORDER — UREA 45 % EX CREA: TOPICAL_CREAM | CUTANEOUS | 2 refills | Status: DC

## 2020-03-12 MED ORDER — ROSUVASTATIN CALCIUM 20 MG PO TABS: 20.0000 mg | ORAL_TABLET | Freq: Every day | ORAL | 3 refills | Status: DC

## 2020-03-12 NOTE — Assessment & Plan Note (Signed)
Last vitamin D Lab Results  Component Value Date   VD25OH 55.83 09/07/2018   Stable, cont oral replacement  

## 2020-03-12 NOTE — Assessment & Plan Note (Signed)
Lab Results  Component Value Date   HGBA1C 6.2 03/05/2020   Stable, pt to continue current medical treatment  - diet  

## 2020-03-12 NOTE — Patient Instructions (Addendum)
Ok to take the asa 81 mg per day  Ok to change the liptior 20 mg to the crestor 20 mg per day  Please continue all other medications as before, and refills have been done if requested.  Please have the pharmacy call with any other refills you may need.  Please continue your efforts at being more active, low cholesterol diet, and weight control.  You are otherwise up to date with prevention measures today.  Please keep your appointments with your specialists as you may have planned  Please go to the LAB at the blood drawing area for the tests to be done  You will be contacted by phone if any changes need to be made immediately.  Otherwise, you will receive a letter about your results with an explanation, but please check with MyChart first.  Please remember to sign up for MyChart if you have not done so, as this will be important to you in the future with finding out test results, communicating by private email, and scheduling acute appointments online when needed.  Please make an Appointment to return for your 1 year visit, or sooner if needed, with Lab testing by Appointment as well, to be done about 3-5 days before at the [facility] (so this is for TWO appointments - please see the scheduling desk as you leave)  Due to the ongoing Covid 19 pandemic, our lab now requires an appointment for any labs done at our office.  If you need labs done and do not have an appointment, please call our office ahead of time to schedule before presenting to the lab for your testing.

## 2020-03-12 NOTE — Assessment & Plan Note (Signed)
Stable, cont asa 81 and statin

## 2020-03-12 NOTE — Assessment & Plan Note (Addendum)
Lab Results  Component Value Date   LDLCALC 70 03/05/2020   Stable, pt to change lipitor to crestor  Current Outpatient Medications (Endocrine & Metabolic):  .  denosumab (PROLIA) 60 MG/ML SOSY injection, Inject 60 mg into the skin every 6 (six) months.  Current Outpatient Medications (Cardiovascular):  .  rosuvastatin (CRESTOR) 20 MG tablet, Take 1 tablet (20 mg total) by mouth daily.   Current Outpatient Medications (Analgesics):  .  aspirin 81 MG EC tablet, Take 1 tablet (81 mg total) by mouth daily. Swallow whole. .  ibuprofen (ADVIL) 400 MG tablet,    Current Outpatient Medications (Other):  Marland Kitchen  CALCIUM PO, Take 1,000 mg by mouth. .  Doxylamine Succinate, Sleep, (SLEEP AID PO), Take by mouth at bedtime. .  Urea 45 % CREA, Use as directed twice per day as needed .  VITAMIN D-VITAMIN K PO, Take by mouth. Vitamin d 5000

## 2020-03-12 NOTE — Progress Notes (Addendum)
Patient ID: Casey Conley, female   DOB: [birth year], 65 y.o.   MRN: [id number]         Chief Complaint:: wellness exam and TIA, statin muscle pain, HLD,, elevated BP, hyperglycemia         HPI:  Casey Conley is a 65 y.o. female here for wellness exam o/w up to date.  Pt denies chest pain, increased sob or doe, wheezing, orthopnea, PND, increased LE swelling, palpitations, dizziness or syncope.   Pt denies polydipsia, polyuria, Pt denies new neurological symptoms such as new headache, or facial or extremity weakness or numbness        Wt Readings from Last 3 Encounters:  03/12/20 168 lb (76.2 kg)  02/23/19 171 lb (77.6 kg)  01/17/19 165 lb (74.8 kg)   BP Readings from Last 3 Encounters:  03/12/20 120/76  02/23/19 (!) 158/82  01/17/19 138/62   Immunization History  Administered Date(s) Administered  . DTaP 02/02/2006  . Influenza Inj Mdck Quad Pf 11/29/2017  . Influenza Split 01/16/2011, 01/12/2012  . Influenza Whole 10/29/2008  . Influenza, High Dose Seasonal PF 11/03/2018  . Influenza,inj,Quad PF,6+ Mos 12/21/2012, 12/19/2014  . Influenza-Unspecified 12/04/2019  . PFIZER Comirnaty(Gray Top)Covid-19 Tri-Sucrose Vaccine 04/03/2019, 05/08/2019, 12/04/2019  . Td 02/02/2005  . Tdap 02/16/2017  . Zoster Recombinat (Shingrix) 09/02/2017, 01/06/2018   There are no preventive care reminders to display for this patient.       Also had difficulty with getting up off the floor without pulling up while on the crestor, but resolved with holding the statin.  Asks for change of statin.  Has also been taking the Asa at 81 mg instead of 325 mg, does not want to take the higher dose.    Past Medical History:  Diagnosis Date  . Abnormal Pap smear of cervix    12-29-17 neg HPV HR+ 16,18/45 neg  . ANEMIA-IRON DEFICIENCY 10/29/2008  . Dysuria 03/29/2009  . Family history of breast cancer 03/11/2020  . Family history of colon cancer 03/11/2020  . Fibroid, uterine   . History of fibrocystic disease of breast    . OSTEOPOROSIS 10/29/2008  . Osteoporosis    on Bisphosphonate for aprox 2007  . Proteinuria    24 hour-normal   Past Surgical History:  Procedure Laterality Date  . BREAST LUMPECTOMY  approx 1990   benign-left  . TONGUE SURGERY     had growth removed  . UTERINE FIBROID EMBOLIZATION     s/p    reports that she has never smoked. She has never used smokeless tobacco. She reports that she does not drink alcohol and does not use drugs. family history includes Arthritis in her mother; Breast cancer in her mother; Cancer in her maternal uncle and paternal uncle; Cancer (age of onset: 31) in her maternal grandmother; Colon cancer (age of onset: 24) in her father; Colon polyps in her maternal uncle; Dementia in her mother; Diabetes in her father and paternal grandfather; Heart disease in her father and paternal grandfather; Hyperlipidemia in her father; Lung cancer (age of onset: 61) in her father; Osteoporosis in her mother; Transient ischemic attack in her mother; Uterine cancer in her paternal grandmother. No Known Allergies Current Outpatient Medications on File Prior to Visit  Medication Sig Dispense Refill  . CALCIUM PO Take 1,000 mg by mouth.    . denosumab (PROLIA) 60 MG/ML SOSY injection Inject 60 mg into the skin every 6 (six) months.    . Doxylamine Succinate, Sleep, (SLEEP AID PO)  Take by mouth at bedtime.    Marland Kitchen VITAMIN D-VITAMIN K PO Take by mouth. Vitamin d 5000    . ibuprofen (ADVIL) 400 MG tablet  (Patient not taking: Reported on 03/12/2020)     No current facility-administered medications on file prior to visit.        ROS:  All others reviewed and negative.  Objective        PE:  BP 120/76   Pulse 76   Temp 98.2 F (36.8 C) (Oral)   Ht 5\' 5"  (1.651 m)   Wt 168 lb (76.2 kg)   LMP 09/02/2004   SpO2 97%   BMI 27.96 kg/m                 Constitutional: Pt appears in NAD               HENT: Head: NCAT.                Right Ear: External ear normal.                  Left Ear: External ear normal.                Eyes: . Pupils are equal, round, and reactive to light. Conjunctivae and EOM are normal               Nose: without d/c or deformity               Neck: Neck supple. Gross normal ROM               Cardiovascular: Normal rate and regular rhythm.                 Pulmonary/Chest: Effort normal and breath sounds without rales or wheezing.                Abd:  Soft, NT, ND, + BS, no organomegaly               Neurological: Pt is alert. At baseline orientation, motor grossly intact               Skin: Skin is warm. No rashes, no other new lesions, LE edema - none               Psychiatric: Pt behavior is normal without agitation   Micro: none  Cardiac tracings I have personally interpreted today:  none  Pertinent Radiological findings (summarize):  MRI HEAD WO CONTRAST   Impression  IMPRESSION:  No acute intracranial abnormality.   Electronically Signed by: Bryon Lions  Narrative  INDICATION: Transient cerebral ischemic attack, unspecified. Tingling in the jaw for 5 days. History of prior stroke.   STUDY: MRI of the head without intravenous contrast performed on 01/19/2019 10:24 AM.   COMPARISON: None.   TECHNIQUE: Multiplanar,multisequence MR imaging of the brain was obtained without IV contrast.   Contrast: None.   FINDINGS:  # Skull/marrow/soft tissues: Unremarkable.  # Orbits: Unremarkable.  # Sinuses/mastoid air cells: Unremarkable.  # Vessels: Normal flow voids within the major intracranial vessels.  # Brain: The parenchyma, sulci, cisterns and ventricles are unremarkable. No acute infarction is identified. There is no extra-axial fluid collection or intracranial hemorrhage. There is no mass effect or midline shift.  # Additional comments: Small amount of fluid in the LEFT petrous apex that probably represents trapped fluid. This is not expansile, no restricted diffusion and the adjacent cortex appears to be  intact.  Other Result Text  Acute Interface, Incoming Rad Results - 01/20/2019 9:59 AM EST  INDICATION: Transient cerebral ischemic attack, unspecified. Tingling in the jaw for 5 days. History of prior stroke.   STUDY: MRI of the head without intravenous contrast performed on 01/19/2019 10:24 AM.   COMPARISON: None.   TECHNIQUE: Multiplanar, multisequence MR imaging of the brain was obtained without IV contrast.   Contrast: None.   FINDINGS:  # Skull/marrow/soft tissues: Unremarkable.  # Orbits: Unremarkable.  # Sinuses/mastoid air cells: Unremarkable.  # Vessels: Normal flow voids within the major intracranial vessels.  # Brain: The parenchyma, sulci, cisterns and ventricles are unremarkable. No acute infarction is identified. There is no extra-axial fluid collectionor intracranial hemorrhage. There is no mass effect or midline shift.  # Additional comments: Small amount of fluid in the LEFT petrous apex that probably represents trapped fluid. This is not expansile, no restricted diffusion and the adjacent cortex appears to be intact.    IMPRESSION:  No acute intracranial abnormality.   Electronically Signed by: Bryon Lions Specimen Collected: -- Last Resulted: --  Date: 01/20/19   Received From: [facility]       Lab Results  Component Value Date   WBC 6.2 03/05/2020   HGB 14.7 03/05/2020   HCT 43.5 03/05/2020   PLT 223.0 03/05/2020   GLUCOSE 85 03/05/2020   CHOL 184 03/05/2020   TRIG 103.0 03/05/2020   HDL 93.20 03/05/2020   LDLDIRECT 103.6 06/01/2012   LDLCALC 70 03/05/2020   ALT 43 (H) 03/05/2020   AST 31 03/05/2020   NA 139 03/05/2020   K 4.1 03/05/2020   CL 103 03/05/2020   CREATININE 0.79 03/05/2020   BUN 18 03/05/2020   CO2 31 03/05/2020   TSH 3.18 03/05/2020   HGBA1C 6.2 03/05/2020   Assessment/Plan:  Casey Conley is a 65 y.o. White or Caucasian [1] female with  has a past medical history of Abnormal Pap smear of cervix, ANEMIA-IRON  DEFICIENCY (10/29/2008), Dysuria (03/29/2009), Family history of breast cancer (03/11/2020), Family history of colon cancer (03/11/2020), Fibroid, uterine, History of fibrocystic disease of breast, OSTEOPOROSIS (10/29/2008), Osteoporosis, and Proteinuria. Encounter for well adult exam with abnormal findings Age and sex appropriate education and counseling updated with regular exercise and diet Referrals for preventative services - none needed Immunizations addressed - none needed Smoking counseling  - none needed Evidence for depression or other mood disorder - none significant Most recent labs reviewed. I have personally reviewed and have noted: 1) the patient's medical and social history 2) The patient's current medications and supplements 3) The patient's height, weight, and BMI have been recorded in the chart   TIA (transient ischemic attack) Stable, cont asa 81 and statin   Blood pressure elevated without history of HTN BP Readings from Last 3 Encounters:  03/12/20 120/76  02/23/19 (!) 158/82  01/17/19 138/62   Stable, pt to continue medical treatment  - diet and wt control   HLD (hyperlipidemia) Lab Results  Component Value Date   LDLCALC 70 03/05/2020   Stable, pt to change lipitor to crestor  Current Outpatient Medications (Endocrine & Metabolic):  .  denosumab (PROLIA) 60 MG/ML SOSY injection, Inject 60 mg into the skin every 6 (six) months.  Current Outpatient Medications (Cardiovascular):  .  rosuvastatin (CRESTOR) 20 MG tablet, Take 1 tablet (20 mg total) by mouth daily.   Current Outpatient Medications (Analgesics):  .  aspirin 81 MG EC tablet, Take 1 tablet (81 mg total) by mouth  daily. Swallow whole. .  ibuprofen (ADVIL) 400 MG tablet,    Current Outpatient Medications (Other):  Marland Kitchen  CALCIUM PO, Take 1,000 mg by mouth. .  Doxylamine Succinate, Sleep, (SLEEP AID PO), Take by mouth at bedtime. .  Urea 45 % CREA, Use as directed twice per day as needed .  VITAMIN  D-VITAMIN K PO, Take by mouth. Vitamin d 5000   Hyperglycemia Lab Results  Component Value Date   HGBA1C 6.2 03/05/2020   Stable, pt to continue current medical treatment  - diet   Vitamin D deficiency Last vitamin D Lab Results  Component Value Date   VD25OH 55.83 09/07/2018   Stable, cont oral replacement   Followup: Return in about 1 year (around 03/12/2021).  Cathlean Cower, MD 03/12/2020 9:04 PM [facility]

## 2020-03-12 NOTE — Assessment & Plan Note (Signed)
BP Readings from Last 3 Encounters:  03/12/20 120/76  02/23/19 (!) 158/82  01/17/19 138/62   Stable, pt to continue medical treatment  - diet and wt control

## 2020-03-12 NOTE — Assessment & Plan Note (Signed)

## 2020-03-15 ENCOUNTER — Telehealth: Payer: Self-pay

## 2020-03-15 NOTE — Telephone Encounter (Signed)
Ok I have sent the rx so she should just be able to call the pharmacy and let them know\  We can also send to a local pharmacy if she wants

## 2020-03-15 NOTE — Telephone Encounter (Signed)
Casey Conley

## 2020-03-16 MED ORDER — UREA 45 % EX CREA: TOPICAL_CREAM | CUTANEOUS | 2 refills | Status: AC

## 2020-03-16 NOTE — Addendum Note (Signed)
Addended by: Biagio Borg on: 03/16/2020 09:01 AM   Modules accepted: Orders

## 2020-04-17 ENCOUNTER — Ambulatory Visit: Payer: BC Managed Care – PPO | Admitting: Obstetrics and Gynecology

## 2020-04-26 ENCOUNTER — Encounter: Payer: Self-pay | Admitting: Internal Medicine

## 2020-04-26 MED ORDER — HYDROCORTISONE (PERIANAL) 2.5 % EX CREA: 1.0000 "application " | TOPICAL_CREAM | Freq: Two times a day (BID) | CUTANEOUS | 0 refills | Status: DC

## 2020-05-07 NOTE — Progress Notes (Signed)
65 y.o. G0P0000 Married White or Caucasian Not Hispanic or Latino female here for annual exam. No vaginal bleeding. Not sexually active. No bowel or bladder c/o.   Wants to know if she needs additional pap for HPV.   The patient's mammogram from earlier this year had a TC risk of 26.4% risk for breast cancer. She saw the genetic counselor on 03/07/20. They estimated her risk of breast cancer at 22%. She declined genetic testing initially. Has questions about genetic testing.    Patient's last menstrual period was 09/02/2004.          Sexually active: No.  The current method of family planning is post menopausal status.    Exercising: Yes.    walking 3x a week  Smoker:  no  Health Maintenance: Pap:  07-04-15 neg, 12-29-17 neg HPV HR+ 16,18/45 neg 01/02/19 WNL HR HPV NEG History of abnormal Pap:  yes MMG:  02/17/20 Bi-rads 2 benign, TC risk of breast cancer 26.4% BMD:   2020 osteoporosis on prolia, has f/u scheduled. Followed by Orthopedics.  Colonoscopy: 2018 f/u 74yrs, 1 polyp  TDaP:  2019  Gardasil: NA   reports that she has never smoked. She has never used smokeless tobacco. She reports that she does not drink alcohol and does not use drugs.  Past Medical History:  Diagnosis Date  . Abnormal Pap smear of cervix    12-29-17 neg HPV HR+ 16,18/45 neg  . ANEMIA-IRON DEFICIENCY 10/29/2008  . Dysuria 03/29/2009  . Family history of breast cancer 03/11/2020  . Family history of colon cancer 03/11/2020  . Fibroid, uterine   . History of fibrocystic disease of breast   . OSTEOPOROSIS 10/29/2008  . Osteoporosis    on Bisphosphonate for aprox 2007  . Proteinuria    24 hour-normal    Past Surgical History:  Procedure Laterality Date  . BREAST LUMPECTOMY  approx 1990   benign-left  . TONGUE SURGERY     had growth removed  . UTERINE FIBROID EMBOLIZATION     s/p    Current Outpatient Medications  Medication Sig Dispense Refill  . aspirin 81 MG EC tablet Take 1 tablet (81 mg total) by  mouth daily. Swallow whole. 100 tablet 12  . CALCIUM PO Take 1,000 mg by mouth.    . denosumab (PROLIA) 60 MG/ML SOSY injection Inject 60 mg into the skin every 6 (six) months.    . Doxylamine Succinate, Sleep, (SLEEP AID PO) Take by mouth at bedtime.    . rosuvastatin (CRESTOR) 20 MG tablet Take 1 tablet (20 mg total) by mouth daily. 90 tablet 3  . Urea 45 % CREA Use as directed twice per day as needed 255 g 2  . VITAMIN D-VITAMIN K PO Take by mouth. Vitamin d 5000     No current facility-administered medications for this visit.    Family History  Problem Relation Age of Onset  . Arthritis Mother   . Dementia Mother   . Transient ischemic attack Mother   . Osteoporosis Mother   . Breast cancer Mother        dx 70s, dx 17s  . Diabetes Father   . Hyperlipidemia Father   . Heart disease Father        CAD/CABG  . Colon cancer Father 33  . Lung cancer Father 3  . Diabetes Paternal Grandfather   . Heart disease Paternal Grandfather   . Cancer Maternal Grandmother 84       unknown type; ? kidney cancer  .  Uterine cancer Paternal Grandmother        or other "female cancer", dx 60s-70s  . Cancer Maternal Uncle        unknown type; ? colon cancer; dx 90s  . Cancer Paternal Uncle        sacral chordoma, d. 71  . Colon polyps Maternal Uncle        unknown number    Review of Systems  All other systems reviewed and are negative.   Exam:   BP 130/82   Pulse 65   Ht 5\' 5"  (1.651 m)   Wt 162 lb (73.5 kg)   LMP 09/02/2004   SpO2 100%   BMI 26.96 kg/m   Weight change: @WEIGHTCHANGE @ Height:   Height: 5\' 5"  (165.1 cm)  Ht Readings from Last 3 Encounters:  05/09/20 5\' 5"  (1.651 m)  03/12/20 5\' 5"  (1.651 m)  02/23/19 5\' 5"  (1.651 m)    General appearance: alert, cooperative and appears stated age Head: Normocephalic, without obvious abnormality, atraumatic Neck: no adenopathy, supple, symmetrical, trachea midline and thyroid normal to inspection and palpation Lungs: clear to  auscultation bilaterally Cardiovascular: regular rate and rhythm Breasts: normal appearance, no masses or tenderness Abdomen: soft, non-tender; non distended,  no masses,  no organomegaly Extremities: extremities normal, atraumatic, no cyanosis or edema Skin: Skin color, texture, turgor normal. No rashes or lesions Lymph nodes: Cervical, supraclavicular, and axillary nodes normal. No abnormal inguinal nodes palpated Neurologic: Grossly normal   Pelvic: External genitalia:  no lesions              Urethra:  normal appearing urethra with no masses, tenderness or lesions              Bartholins and Skenes: normal                 Vagina: atrophic appearing vagina with normal color and discharge, no lesions              Cervix: no lesions               Bimanual Exam:  Uterus:  normal size, contour, position, consistency, mobility, non-tender              Adnexa: no mass, fullness, tenderness               Rectovaginal: Confirms               Anus:  normal sphincter tone, no lesions  Gae Dry chaperoned for the exam.  1. Well woman exam Discussed breast self exam Discussed calcium and vit D intake Mammogram UTD Colonoscopy next year DEXA with Orthopedics  2. Screening for cervical cancer - Cytology - PAP  3. At increased risk of breast cancer She has seen the Genetic counselor, after discussion she thinks she will do the genetic testing. She will contact the genetic counselor Will schedule a breast MRI

## 2020-05-09 ENCOUNTER — Other Ambulatory Visit: Payer: Self-pay

## 2020-05-09 ENCOUNTER — Encounter: Payer: Self-pay | Admitting: Obstetrics and Gynecology

## 2020-05-09 ENCOUNTER — Ambulatory Visit (INDEPENDENT_AMBULATORY_CARE_PROVIDER_SITE_OTHER): Payer: BC Managed Care – PPO | Admitting: Obstetrics and Gynecology

## 2020-05-09 ENCOUNTER — Other Ambulatory Visit (HOSPITAL_COMMUNITY)
Admission: RE | Admit: 2020-05-09 | Discharge: 2020-05-09 | Disposition: A | Discharge status: Home / Self Care | Payer: BC Managed Care – PPO | Source: Ambulatory Visit | Attending: Obstetrics and Gynecology | Admitting: Obstetrics and Gynecology

## 2020-05-09 VITALS — BP 130/82 | HR 65 | Ht 65.0 in | Wt 162.0 lb

## 2020-05-09 DIAGNOSIS — Z01419 Encounter for gynecological examination (general) (routine) without abnormal findings: Secondary | ICD-10-CM

## 2020-05-09 DIAGNOSIS — Z124 Encounter for screening for malignant neoplasm of cervix: Secondary | ICD-10-CM | POA: Diagnosis not present

## 2020-05-09 DIAGNOSIS — Z9189 Other specified personal risk factors, not elsewhere classified: Secondary | ICD-10-CM | POA: Diagnosis not present

## 2020-05-09 NOTE — Patient Instructions (Signed)

## 2020-05-10 LAB — CYTOLOGY - PAP
Comment: NEGATIVE
Diagnosis: NEGATIVE
High risk HPV: NEGATIVE

## 2020-07-11 ENCOUNTER — Telehealth: Payer: Self-pay | Admitting: *Deleted

## 2020-07-11 DIAGNOSIS — Z9189 Other specified personal risk factors, not elsewhere classified: Secondary | ICD-10-CM

## 2020-07-11 NOTE — Telephone Encounter (Signed)
-----   Message from Salvadore Dom, MD sent at 05/09/2020 12:21 PM EDT ----- Can you please schedule this patient for a breast MRI in 7/22. She needs an open MRI. Original mammogram was at [facility]. Please call the patient and let her know.  Thanks, Sharee Pimple

## 2020-07-11 NOTE — Telephone Encounter (Signed)
Patient informed order has been placed at [facility] she can call to schedule. [place] did say she has new [employer]. I asked if she could send a my chart message with this information so prior approval can be done for MRI once scheduled.

## 2020-07-12 NOTE — Telephone Encounter (Signed)
Patient scheduled at [facility] on 08/07/20 at [place]. Will route to [facility] for prior approval.

## 2020-07-15 NOTE — Telephone Encounter (Signed)
Patient called stating she has new insurance. I will call new insurance for Prior auth for MRI  [facility] [id number] GROUP [id number] [phone]   MEDICARE [id number]

## 2020-07-24 NOTE — Telephone Encounter (Signed)
Called MEDICARE ID [id number]: No Prior auth required reference # [id number]. Pt has not met ded yet $233(24mt) pt aware.

## 2020-08-07 ENCOUNTER — Ambulatory Visit
Admission: RE | Admit: 2020-08-07 | Discharge: 2020-08-07 | Disposition: A | Discharge status: Home / Self Care | Payer: Medicare Other | Source: Ambulatory Visit | Attending: Obstetrics and Gynecology | Admitting: Obstetrics and Gynecology

## 2020-08-07 ENCOUNTER — Other Ambulatory Visit: Payer: Self-pay

## 2020-08-07 DIAGNOSIS — Z9189 Other specified personal risk factors, not elsewhere classified: Secondary | ICD-10-CM

## 2020-08-07 MED ORDER — GADOBUTROL 1 MMOL/ML IV SOLN
7.0000 mL | Freq: Once | INTRAVENOUS | Status: AC | PRN
  Administered 2020-08-07: 7 mL via INTRAVENOUS

## 2020-10-04 ENCOUNTER — Telehealth: Payer: Self-pay | Admitting: Internal Medicine

## 2020-10-04 ENCOUNTER — Other Ambulatory Visit: Payer: Self-pay

## 2020-10-04 ENCOUNTER — Ambulatory Visit (INDEPENDENT_AMBULATORY_CARE_PROVIDER_SITE_OTHER): Payer: Medicare Other | Admitting: Internal Medicine

## 2020-10-04 ENCOUNTER — Encounter: Payer: Self-pay | Admitting: Internal Medicine

## 2020-10-04 VITALS — BP 138/80 | HR 96 | Temp 98.2°F | Ht 65.0 in | Wt 156.0 lb

## 2020-10-04 DIAGNOSIS — R030 Elevated blood-pressure reading, without diagnosis of hypertension: Secondary | ICD-10-CM | POA: Diagnosis not present

## 2020-10-04 DIAGNOSIS — L259 Unspecified contact dermatitis, unspecified cause: Secondary | ICD-10-CM | POA: Diagnosis not present

## 2020-10-04 DIAGNOSIS — E559 Vitamin D deficiency, unspecified: Secondary | ICD-10-CM

## 2020-10-04 DIAGNOSIS — R739 Hyperglycemia, unspecified: Secondary | ICD-10-CM

## 2020-10-04 DIAGNOSIS — R21 Rash and other nonspecific skin eruption: Secondary | ICD-10-CM

## 2020-10-04 MED ORDER — PREDNISONE 10 MG PO TABS: ORAL_TABLET | ORAL | 0 refills | Status: DC

## 2020-10-04 MED ORDER — TRIAMCINOLONE ACETONIDE 0.1 % EX CREA: 1.0000 "application " | TOPICAL_CREAM | Freq: Two times a day (BID) | CUTANEOUS | 0 refills | Status: AC

## 2020-10-04 MED ORDER — METHYLPREDNISOLONE ACETATE 80 MG/ML IJ SUSP
80.0000 mg | Freq: Once | INTRAMUSCULAR | Status: AC
  Administered 2020-10-04: 80 mg via INTRAMUSCULAR

## 2020-10-04 NOTE — Telephone Encounter (Signed)
[id number] cream (KENALOG) 0.1 %  [facility]  requesting clarification on Kenalog cream Should the order be for 30/60/90 day supply?

## 2020-10-04 NOTE — Telephone Encounter (Signed)
Clarification given to pharmacist at [facility] for 30 days supply

## 2020-10-04 NOTE — Patient Instructions (Signed)
You had the steroid shot today  Please take all new medication as prescribed - the prednisone, and steroid cream as needed  Please continue all other medications as before, and refills have been done if requested.  Please have the pharmacy call with any other refills you may need.  Please keep your appointments with your specialists as you may have planned

## 2020-10-04 NOTE — Progress Notes (Addendum)
Patient ID: Casey Conley, female   DOB: 07-27-1955, 65 y.o.   MRN: [id number]        Chief Complaint: follow up rash, hyperglycemia, elev BP without HTN, ow vit d       HPI:  Casey Conley is a 65 y.o. female here with c/o 3 days onset markedly itchy rash starting at the right inner elbow and lower abdomen after working in the yard clearing up vegetation, then just seems to continue to spread to the left arm and upper chest as well.   Pt denies fever, wt loss, night sweats, loss of appetite, or other constitutional symptoms  Pt denies chest pain, increased sob or doe, wheezing, orthopnea, PND, increased LE swelling, palpitations, dizziness or syncope.   Pt denies polydipsia, polyuria, or new focal neuro s/s         Wt Readings from Last 3 Encounters:  10/04/20 156 lb (70.8 kg)  05/09/20 162 lb (73.5 kg)  03/12/20 168 lb (76.2 kg)   BP Readings from Last 3 Encounters:  10/04/20 138/80  05/09/20 130/82  03/12/20 120/76         Past Medical History:  Diagnosis Date   Abnormal Pap smear of cervix    12-29-17 neg HPV HR+ 16,18/45 neg   ANEMIA-IRON DEFICIENCY 10/29/2008   Dysuria 03/29/2009   Family history of breast cancer 03/11/2020   Family history of colon cancer 03/11/2020   Fibroid, uterine    History of fibrocystic disease of breast    OSTEOPOROSIS 10/29/2008   Osteoporosis    on Bisphosphonate for aprox 2007   Proteinuria    24 hour-normal   Past Surgical History:  Procedure Laterality Date   BREAST LUMPECTOMY  approx 1990   benign-left   TONGUE SURGERY     had growth removed   UTERINE FIBROID EMBOLIZATION     s/p    reports that she has never smoked. She has never used smokeless tobacco. She reports that she does not drink alcohol and does not use drugs. family history includes Arthritis in her mother; Breast cancer in her mother; Cancer in her maternal uncle and paternal uncle; Cancer (age of onset: 46) in her maternal grandmother; Colon cancer (age of onset: 45) in her father;  Colon polyps in her maternal uncle; Dementia in her mother; Diabetes in her father and paternal grandfather; Heart disease in her father and paternal grandfather; Hyperlipidemia in her father; Lung cancer (age of onset: 80) in her father; Osteoporosis in her mother; Transient ischemic attack in her mother; Uterine cancer in her paternal grandmother. No Known Allergies Current Outpatient Medications on File Prior to Visit  Medication Sig Dispense Refill   aspirin 81 MG EC tablet Take 1 tablet (81 mg total) by mouth daily. Swallow whole. 100 tablet 12   CALCIUM PO Take 1,000 mg by mouth.     denosumab (PROLIA) 60 MG/ML SOSY injection Inject 60 mg into the skin every 6 (six) months.     Doxylamine Succinate, Sleep, (SLEEP AID PO) Take by mouth at bedtime.     rosuvastatin (CRESTOR) 20 MG tablet Take 1 tablet (20 mg total) by mouth daily. 90 tablet 3   Urea 45 % CREA Use as directed twice per day as needed 255 g 2   VITAMIN D-VITAMIN K PO Take by mouth. Vitamin d 5000     No current facility-administered medications on file prior to visit.        ROS:  All others reviewed and negative.  Objective        PE:  BP 138/80 (BP Location: Left Arm, Patient Position: Sitting, Cuff Size: Normal)   Pulse 96   Temp 98.2 F (36.8 C) (Oral)   Ht '5\' 5"'$  (1.651 m)   Wt 156 lb (70.8 kg)   LMP 09/02/2004   SpO2 96%   BMI 25.96 kg/m                 Constitutional: Pt appears in NAD               HENT: Head: NCAT.                Right Ear: External ear normal.                 Left Ear: External ear normal.                Eyes: . Pupils are equal, round, and reactive to light. Conjunctivae and EOM are normal               Nose: without d/c or deformity               Neck: Neck supple. Gross normal ROM               Cardiovascular: Normal rate and regular rhythm.                 Pulmonary/Chest: Effort normal and breath sounds without rales or wheezing.                Abd:  Soft, NT, ND, + BS, no  organomegaly               Neurological: Pt is alert. At baseline orientation, motor grossly intact               Skin: LE edema - none, multple flat erythem somewhat scaly striated areas to extremities and lower abdomen               Psychiatric: Pt behavior is normal without agitation   Micro: none  Cardiac tracings I have personally interpreted today:  none  Pertinent Radiological findings (summarize): none   Lab Results  Component Value Date   WBC 6.2 03/05/2020   HGB 14.7 03/05/2020   HCT 43.5 03/05/2020   PLT 223.0 03/05/2020   GLUCOSE 85 03/05/2020   CHOL 184 03/05/2020   TRIG 103.0 03/05/2020   HDL 93.20 03/05/2020   LDLDIRECT 103.6 06/01/2012   LDLCALC 70 03/05/2020   ALT 43 (H) 03/05/2020   AST 31 03/05/2020   NA 139 03/05/2020   K 4.1 03/05/2020   CL 103 03/05/2020   CREATININE 0.79 03/05/2020   BUN 18 03/05/2020   CO2 31 03/05/2020   TSH 3.18 03/05/2020   HGBA1C 6.2 03/05/2020   Assessment/Plan:  Casey Conley is a 65 y.o. White or Caucasian [1] female with  has a past medical history of Abnormal Pap smear of cervix, ANEMIA-IRON DEFICIENCY (10/29/2008), Dysuria (03/29/2009), Family history of breast cancer (03/11/2020), Family history of colon cancer (03/11/2020), Fibroid, uterine, History of fibrocystic disease of breast, OSTEOPOROSIS (10/29/2008), Osteoporosis, and Proteinuria.  Vitamin D deficiency . Last vitamin D Lab Results  Component Value Date   VD25OH 55.83 09/07/2018   Stable, cont oral replacement   Rash C/w contact dermatitis with multiple areas bilat UEs and across the lower abdomen - for depomedrol im 80, predpac asd, triam cr prn,  to f/u any worsening  symptoms or concerns  Contact dermatitis C/w contact dermatitis with multiple areas bilat UEs and across the lower abdomen - for depomedrol im 80, predpac asd, triam cr prn,  to f/u any worsening symptoms or concerns   Blood pressure elevated without history of HTN BP Readings from Last 3  Encounters:  10/04/20 138/80  05/09/20 130/82  03/12/20 120/76   Stable, pt to continue medical treatment  - wt control, low salt diet   Hyperglycemia Lab Results  Component Value Date   HGBA1C 6.2 03/05/2020   Stable, pt to continue current medical treatment  - diet  Followup: Return if symptoms worsen or fail to improve.  Cathlean Cower, MD 10/07/2020 8:43 PM [facility]

## 2020-10-07 ENCOUNTER — Encounter: Payer: Self-pay | Admitting: Internal Medicine

## 2020-10-07 NOTE — Assessment & Plan Note (Signed)
BP Readings from Last 3 Encounters:  10/04/20 138/80  05/09/20 130/82  03/12/20 120/76   Stable, pt to continue medical treatment  - wt control, low salt diet

## 2020-10-07 NOTE — Assessment & Plan Note (Signed)
C/w contact dermatitis with multiple areas bilat UEs and across the lower abdomen - for depomedrol im 80, predpac asd, triam cr prn,  to f/u any worsening symptoms or concerns

## 2020-10-07 NOTE — Assessment & Plan Note (Signed)
.   Last vitamin D Lab Results  Component Value Date   VD25OH 55.83 09/07/2018   Stable, cont oral replacement  

## 2020-10-07 NOTE — Assessment & Plan Note (Signed)
Lab Results  Component Value Date   HGBA1C 6.2 03/05/2020   Stable, pt to continue current medical treatment  - diet  

## 2020-11-06 ENCOUNTER — Telehealth: Payer: Self-pay | Admitting: Internal Medicine

## 2020-11-06 NOTE — Telephone Encounter (Signed)
Needing provider or nurse to call & provide the DS for triamcinolone cream (KENALOG) 0.1 % written 10/04/20

## 2020-11-07 NOTE — Telephone Encounter (Signed)
Spoke with patient and she states that she no longer needs prescription for poison ivy as it has cleared with the prednisone. Pharmacy notified.

## 2020-12-09 LAB — HM DIABETES EYE EXAM

## 2020-12-20 ENCOUNTER — Other Ambulatory Visit: Payer: Self-pay

## 2020-12-20 ENCOUNTER — Ambulatory Visit (INDEPENDENT_AMBULATORY_CARE_PROVIDER_SITE_OTHER): Payer: Medicare Other

## 2020-12-20 DIAGNOSIS — Z23 Encounter for immunization: Secondary | ICD-10-CM | POA: Diagnosis not present

## 2020-12-20 NOTE — Progress Notes (Signed)
Pt was given HD flu vacc w/o any complications. 

## 2021-02-18 ENCOUNTER — Encounter: Payer: Self-pay | Admitting: Obstetrics and Gynecology

## 2021-02-20 ENCOUNTER — Other Ambulatory Visit: Payer: Self-pay | Admitting: Internal Medicine

## 2021-02-20 ENCOUNTER — Encounter: Payer: Self-pay | Admitting: Internal Medicine

## 2021-02-20 NOTE — Telephone Encounter (Signed)
Please refill as per office routine med refill policy (all routine meds to be refilled for 3 mo or monthly (per pt preference) up to one year from last visit, then month to month grace period for 3 mo, then further med refills will have to be denied) ? ?

## 2021-05-05 NOTE — Progress Notes (Signed)
66 y.o. [place] Married White or Caucasian Not Hispanic or Latino female here for breast and pelvic.   No vaginal bleeding. ? ?Occasional GSI with sneeze or strong cough. Leaks a small amount.  ? ?No bowel c/o. ? ?Not sexually active.  ?  ?The patient's mammogram from last year had a TC risk of 26.4% risk for breast cancer. ?She saw the genetic counselor on 03/07/20. They estimated her risk of breast cancer at 22%.  ? ?Patient's last menstrual period was 09/02/2004.          ?Sexually active: No.  ?The current method of family planning is post menopausal status.    ?Exercising: No.   She has an active lifestyle ?Smoker:  no ? ?Health Maintenance: ?Pap:  05/09/20 WNL, neg hpv; 01/02/19 WNL HR HPV NEG, 12-29-17 neg HPV HR+ 16,18/45 neg,  07-04-15 neg ?History of abnormal Pap:  yes ?MMG:  02/17/21 Bi-rads 1 neg  ?Breast MRI 08/07/20 negative ?BMD: 07/15/20 Osteopenia, on prolia ?scheduled. Followed by Orthopedics.  ?Colonoscopy: 2018 f/u 28yr, 1 polyp. Due this year.  ?TDaP:  2019 ?Gardasil: n/a ? ? reports that she has never smoked. She has never used smokeless tobacco. She reports that she does not drink alcohol and does not use drugs. No children.  ? ?Past Medical History:  ?Diagnosis Date  ? Abnormal Pap smear of cervix   ? 12-29-17 neg HPV HR+ 16,18/45 neg  ? ANEMIA-IRON DEFICIENCY 10/29/2008  ? Dysuria 03/29/2009  ? Family history of breast cancer 03/11/2020  ? Family history of colon cancer 03/11/2020  ? Fibroid, uterine   ? History of fibrocystic disease of breast   ? OSTEOPOROSIS 10/29/2008  ? Osteoporosis   ? on Bisphosphonate for aprox 2007  ? Proteinuria   ? 24 hour-normal  ? ? ?Past Surgical History:  ?Procedure Laterality Date  ? BREAST LUMPECTOMY  approx 1990  ? benign-left  ? TONGUE SURGERY    ? had growth removed  ? UTERINE FIBROID EMBOLIZATION    ? s/p  ? ? ?Current Outpatient Medications  ?Medication Sig Dispense Refill  ? aspirin 81 MG EC tablet Take 1 tablet (81 mg total) by mouth daily. Swallow whole. 100 tablet  12  ? CALCIUM PO Take 1,000 mg by mouth.    ? denosumab (PROLIA) 60 MG/ML SOSY injection Inject 60 mg into the skin every 6 (six) months.    ? Doxylamine Succinate, Sleep, (SLEEP AID PO) Take by mouth at bedtime.    ? rosuvastatin (CRESTOR) 20 MG tablet TAKE ONE TABLET BY MOUTH ONE TIME DAILY 90 tablet 0  ? triamcinolone cream (KENALOG) 0.1 % Apply 1 application topically 2 (two) times daily. 30 g 0  ? Urea 45 % CREA Use as directed twice per day as needed 255 g 2  ? VITAMIN D-VITAMIN K PO Take by mouth. Vitamin d 5000    ? ?No current facility-administered medications for this visit.  ? ? ?Family History  ?Problem Relation Age of Onset  ? Arthritis Mother   ? Dementia Mother   ? Transient ischemic attack Mother   ? Osteoporosis Mother   ? Breast cancer Mother   ?     dx 685s dx 953s ? Diabetes Father   ? Hyperlipidemia Father   ? Heart disease Father   ?     CAD/CABG  ? Colon cancer Father 764 ? Lung cancer Father 726 ? Diabetes Paternal Grandfather   ? Heart disease Paternal Grandfather   ? Cancer  Maternal Grandmother 84  ?     unknown type; ? kidney cancer  ? Uterine cancer Paternal Grandmother   ?     or other "female cancer", dx 60s-70s  ? Cancer Maternal Uncle   ?     unknown type; ? colon cancer; dx 90s  ? Cancer Paternal Uncle   ?     sacral chordoma, d. 60  ? Colon polyps Maternal Uncle   ?     unknown number  ? ? ?Review of Systems  ?All other systems reviewed and are negative. ? ?Exam:   ?BP 124/72   Pulse 88   Ht '5\' 5"'$  (1.651 m)   Wt 160 lb (72.6 kg)   LMP 09/02/2004   SpO2 99%   BMI 26.63 kg/m?   Weight change: '@WEIGHTCHANGE'$ @ Height:   Height: '5\' 5"'$  (165.1 cm)  ?Ht Readings from Last 3 Encounters:  ?05/13/21 '5\' 5"'$  (1.651 m)  ?10/04/20 '5\' 5"'$  (1.651 m)  ?05/09/20 '5\' 5"'$  (1.651 m)  ? ? ?General appearance: alert, cooperative and appears stated age ?Head: Normocephalic, without obvious abnormality, atraumatic ?Neck: no adenopathy, supple, symmetrical, trachea midline and thyroid normal to inspection  and palpation ?Breasts: normal appearance, no masses or tenderness ?Abdomen: soft, non-tender; non distended,  no masses,  no organomegaly ?Extremities: extremities normal, atraumatic, no cyanosis or edema ?Skin: Skin color, texture, turgor normal. No rashes or lesions ?Lymph nodes: Cervical, supraclavicular, and axillary nodes normal. ?No abnormal inguinal nodes palpated ?Neurologic: Grossly normal ? ? ?Pelvic: External genitalia:  no lesions ?             Urethra:  normal appearing urethra with no masses, tenderness or lesions ?             Bartholins and Skenes: normal    ?             Vagina: normal appearing vagina with normal color and discharge, no lesions ?             Cervix: no lesions ?              ?Bimanual Exam:  Uterus:   no masses or tenderness ?             Adnexa: no mass, fullness, tenderness ?              Rectovaginal: Confirms ?              Anus:  normal sphincter tone, no lesions ? ?Wandra Scot chaperoned for the exam. ? ? ?1. Gynecologic exam normal ?No pap this year ?Labs with primary ?DEXA with Ortho ?Discussed calcium and vit D intake ? ?2. At increased risk of breast cancer ?Discussed breast self exam, information given and reviewed ?Mammogram in 1/24 ?Breast MRI in 7/24 (will do every 2 years). Discussed the finding of residual IV contrast dye and unknown implications ?Return here in one year for a breast exam ?She has seen the Dietitian. She didn't do genetic testing. We discussed genetic testing again and she has decided she wants to do it. She will call the Genetic center to arrange testing. ? ?3. History of HPV infection ?Pap with hpv in 2019, neg in 20 and 22 ?No pap this year ? ?4. GSI (genuine stress incontinence), female ?Discussed kegels, information given ? ?In addition to the breast and pelvic exam, over 20 minutes was spent in patient counseling about breast cancer, hpv and GSI. ?

## 2021-05-13 ENCOUNTER — Ambulatory Visit (INDEPENDENT_AMBULATORY_CARE_PROVIDER_SITE_OTHER): Payer: Medicare Other | Admitting: Obstetrics and Gynecology

## 2021-05-13 ENCOUNTER — Encounter: Payer: Self-pay | Admitting: Obstetrics and Gynecology

## 2021-05-13 VITALS — BP 124/72 | HR 88 | Ht 65.0 in | Wt 160.0 lb

## 2021-05-13 DIAGNOSIS — Z9189 Other specified personal risk factors, not elsewhere classified: Secondary | ICD-10-CM | POA: Diagnosis not present

## 2021-05-13 DIAGNOSIS — Z8619 Personal history of other infectious and parasitic diseases: Secondary | ICD-10-CM

## 2021-05-13 DIAGNOSIS — N393 Stress incontinence (female) (male): Secondary | ICD-10-CM | POA: Diagnosis not present

## 2021-05-13 DIAGNOSIS — Z01419 Encounter for gynecological examination (general) (routine) without abnormal findings: Secondary | ICD-10-CM

## 2021-05-13 NOTE — Patient Instructions (Addendum)
Breast Self-Awareness ?Breast self-awareness means being familiar with how your breasts look and feel. It involves checking your breasts regularly and reporting any changes to your health care provider. ?Practicing breast self-awareness is important. Sometimes changes may not be harmful (are benign), but sometimes a change in your breasts can be a sign of a serious medical problem. It is important to learn how to do this procedure correctly so that you can catch problems early, when treatment is more likely to be successful. All women should practice breast self-awareness, including women who have had breast implants. ?What you need: ?A mirror. ?A well-lit room. ?How to do a breast self-exam ?A breast self-exam is one way to learn what is normal for your breasts and whether your breasts are changing. To do a breast self-exam: ?Look for changes ? ?Remove all the clothing above your waist. ?Stand in front of a mirror in a room with good lighting. ?Put your hands on your hips. ?Push your hands firmly downward. ?Compare your breasts in the mirror. Look for differences between them (asymmetry), such as: ?Differences in shape. ?Differences in size. ?Puckers, dips, and bumps in one breast and not the other. ?Look at each breast for changes in the skin, such as: ?Redness. ?Scaly areas. ?Look for changes in your nipples, such as: ?Discharge. ?Bleeding. ?Dimpling. ?Redness. ?A change in position. ?Feel for changes ?Carefully feel your breasts for lumps and changes. It is best to do this while lying on your back on the floor, and again while sitting or standing in the tub or shower with soapy water on your skin. Feel each breast in the following way: ?Place the arm on the side of the breast you are examining above your head. ?Feel your breast with the other hand. ?Start in the nipple area and make ?-inch (2 cm) overlapping circles to feel your breast. Use the pads of your three middle fingers to do this. Apply light pressure,  then medium pressure, then firm pressure. The light pressure will allow you to feel the tissue closest to the skin. The medium pressure will allow you to feel the tissue that is a little deeper. The firm pressure will allow you to feel the tissue close to the ribs. ?Continue the overlapping circles, moving downward over the breast until you feel your ribs below your breast. ?Move one finger-width toward the center of the body. Continue to use the ?-inch (2 cm) overlapping circles to feel your breast as you move slowly up toward your collarbone. ?Continue the up-and-down exam using all three pressures until you reach your armpit. ? ?Write down what you find ?Writing down what you find can help you remember what to discuss with your health care provider. Write down: ?What is normal for each breast. ?Any changes that you find in each breast, including: ?The kind of changes you find. ?Any pain or tenderness. ?Size and location of any lumps. ?Where you are in your menstrual cycle, if you are still menstruating. ?General tips and recommendations ?Examine your breasts every month. ?If you are breastfeeding, the best time to examine your breasts is after a feeding or after using a breast pump. ?If you menstruate, the best time to examine your breasts is 5-7 days after your period. Breasts are generally lumpier during menstrual periods, and it may be more difficult to notice changes. ?With time and practice, you will become more familiar with the variations in your breasts and more comfortable with the exam. ?Contact a health care provider if you: ?  See a change in the shape or size of your breasts or nipples. ?See a change in the skin of your breast or nipples, such as a reddened or scaly area. ?Have unusual discharge from your nipples. ?Find a lump or thick area that was not there before. ?Have pain in your breasts. ?Have any concerns related to your breast health. ?Summary ?Breast self-awareness includes looking for  physical changes in your breasts, as well as feeling for any changes within your breasts. ?Breast self-awareness should be performed in front of a mirror in a well-lit room. ?You should examine your breasts every month. If you menstruate, the best time to examine your breasts is 5-7 days after your menstrual period. ?Let your health care provider know of any changes you notice in your breasts, including changes in size, changes on the skin, pain or tenderness, or unusual fluid from your nipples. ?This information is not intended to replace advice given to you by your health care provider. Make sure you discuss any questions you have with your health care provider. ?Document Revised: 09/07/2017 Document Reviewed: 09/07/2017 ?Elsevier Patient Education ? [place]. ?Kegel Exercises ?Kegel exercises can help strengthen your pelvic floor muscles. The pelvic floor is a group of muscles that support your rectum, small intestine, and bladder. In females, pelvic floor muscles also help support the uterus. These muscles help you control the flow of urine and stool (feces). ?Kegel exercises are painless and simple. They do not require any equipment. Your provider may suggest Kegel exercises to: ?Improve bladder and bowel control. ?Improve sexual response. ?Improve weak pelvic floor muscles after surgery to remove the uterus (hysterectomy) or after pregnancy, in females. ?Improve weak pelvic floor muscles after prostate gland removal or surgery, in males. ?Kegel exercises involve squeezing your pelvic floor muscles. These are the same muscles you squeeze when you try to stop the flow of urine or keep from passing gas. The exercises can be done while sitting, standing, or lying down, but it is best to vary your position. ?Ask your health care provider which exercises are safe for you. Do exercises exactly as told by your health care provider and adjust them as directed. Do not begin these exercises until told by your  health care provider. ?Exercises ?How to do Kegel exercises: ?Squeeze your pelvic floor muscles tight. You should feel a tight lift in your rectal area. If you are a female, you should also feel a tightness in your vaginal area. Keep your stomach, buttocks, and legs relaxed. ?Hold the muscles tight for up to 10 seconds. ?Breathe normally. ?Relax your muscles for up to 10 seconds. ?Repeat as told by your health care provider. ?Repeat this exercise daily as told by your health care provider. Continue to do this exercise for at least 4-6 weeks, or for as long as told by your health care provider. ?You may be referred to a physical therapist who can help you learn more about how to do Kegel exercises. ?Depending on your condition, your health care provider may recommend: ?Varying how long you squeeze your muscles. ?Doing several sets of exercises every day. ?Doing exercises for several weeks. ?Making Kegel exercises a part of your regular exercise routine. ?This information is not intended to replace advice given to you by your health care provider. Make sure you discuss any questions you have with your health care provider. ?Document Revised: 05/30/2020 Document Reviewed: 05/30/2020 ?Elsevier Patient Education ? [place]. ? ?

## 2021-05-22 ENCOUNTER — Other Ambulatory Visit: Payer: Self-pay | Admitting: Internal Medicine

## 2021-05-22 NOTE — Telephone Encounter (Signed)
Please refill as per office routine med refill policy (all routine meds to be refilled for 3 mo or monthly (per pt preference) up to one year from last visit, then month to month grace period for 3 mo, then further med refills will have to be denied) ? ?

## 2021-06-17 ENCOUNTER — Other Ambulatory Visit: Payer: Self-pay | Admitting: Internal Medicine

## 2021-06-17 NOTE — Telephone Encounter (Signed)
Please refill as per office routine med refill policy (all routine meds to be refilled for 3 mo or monthly (per pt preference) up to one year from last visit, then month to month grace period for 3 mo, then further med refills will have to be denied) ? ?

## 2021-07-15 ENCOUNTER — Encounter: Payer: Self-pay | Admitting: Internal Medicine

## 2021-08-28 ENCOUNTER — Ambulatory Visit (INDEPENDENT_AMBULATORY_CARE_PROVIDER_SITE_OTHER): Payer: Medicare Other | Admitting: Internal Medicine

## 2021-08-28 ENCOUNTER — Other Ambulatory Visit (HOSPITAL_COMMUNITY): Payer: Self-pay

## 2021-08-28 VITALS — BP 138/86 | HR 85 | Temp 98.1°F | Ht 65.0 in | Wt 162.4 lb

## 2021-08-28 DIAGNOSIS — R635 Abnormal weight gain: Secondary | ICD-10-CM

## 2021-08-28 DIAGNOSIS — Z23 Encounter for immunization: Secondary | ICD-10-CM

## 2021-08-28 DIAGNOSIS — R030 Elevated blood-pressure reading, without diagnosis of hypertension: Secondary | ICD-10-CM | POA: Diagnosis not present

## 2021-08-28 DIAGNOSIS — E7800 Pure hypercholesterolemia, unspecified: Secondary | ICD-10-CM | POA: Diagnosis not present

## 2021-08-28 DIAGNOSIS — R739 Hyperglycemia, unspecified: Secondary | ICD-10-CM

## 2021-08-28 DIAGNOSIS — E559 Vitamin D deficiency, unspecified: Secondary | ICD-10-CM

## 2021-08-28 MED ORDER — ROSUVASTATIN CALCIUM 20 MG PO TABS: 20.0000 mg | ORAL_TABLET | ORAL | 3 refills | Status: DC

## 2021-08-28 MED ORDER — WEGOVY 0.25 MG/0.5ML ~~LOC~~ SOAJ
0.2500 mg | SUBCUTANEOUS | 11 refills | Status: DC
  Filled 2021-08-28: qty 2, 28d supply, fill #0

## 2021-08-28 NOTE — Patient Instructions (Addendum)
Ok to take the crestor 20 mg every other day for now  [facility] for the Prevnar 20 shot today  [place] for [employer]  Please continue all other medications as before, and refills have been done if requested.  Please have the pharmacy call with any other refills you may need.  Please keep your appointments with your specialists as you may have planned  Please make an Appointment to return in 3 months, or sooner if needed, also with Lab Appointment for testing done 3-5 days before at the [facility] (so this is for TWO appointments - please see the scheduling desk as you leave)

## 2021-08-28 NOTE — Progress Notes (Signed)
Patient ID: Casey Conley, female   DOB: Sep 23, 1955, 66 y.o.   MRN: [id number]        Chief Complaint: follow up hld, overweight, low vit d, BP       HPI:  Casey Conley is a 66 y.o. female here overall doing ok, Pt denies chest pain, increased sob or doe, wheezing, orthopnea, PND, increased LE swelling, palpitations, dizziness or syncope.   Pt denies polydipsia, polyuria, or new focal neuro s/s.    Pt denies fever, wt loss, night sweats, loss of appetite, or other constitutional symptoms Taking Vit d.  Hard to lose extra weight despite decreased calories and activity  due for prevnar 20       Wt Readings from Last 3 Encounters:  08/28/21 162 lb 6 oz (73.7 kg)  05/13/21 160 lb (72.6 kg)  10/04/20 156 lb (70.8 kg)   BP Readings from Last 3 Encounters:  08/28/21 138/86  05/13/21 124/72  10/04/20 138/80         Past Medical History:  Diagnosis Date   Abnormal Pap smear of cervix    12-29-17 neg HPV HR+ 16,18/45 neg   ANEMIA-IRON DEFICIENCY 10/29/2008   Dysuria 03/29/2009   Family history of breast cancer 03/11/2020   Family history of colon cancer 03/11/2020   Fibroid, uterine    History of fibrocystic disease of breast    OSTEOPOROSIS 10/29/2008   Osteoporosis    on Bisphosphonate for aprox 2007   Proteinuria    24 hour-normal   Past Surgical History:  Procedure Laterality Date   BREAST LUMPECTOMY  approx 1990   benign-left   TONGUE SURGERY     had growth removed   UTERINE FIBROID EMBOLIZATION     s/p    reports that she has never smoked. She has never used smokeless tobacco. She reports that she does not drink alcohol and does not use drugs. family history includes Arthritis in her mother; Breast cancer in her mother; Cancer in her maternal uncle and paternal uncle; Cancer (age of onset: 43) in her maternal grandmother; Colon cancer (age of onset: 78) in her father; Colon polyps in her maternal uncle; Dementia in her mother; Diabetes in her father and paternal grandfather; Heart disease  in her father and paternal grandfather; Hyperlipidemia in her father; Lung cancer (age of onset: 75) in her father; Osteoporosis in her mother; Transient ischemic attack in her mother; Uterine cancer in her paternal grandmother. No Known Allergies Current Outpatient Medications on File Prior to Visit  Medication Sig Dispense Refill   aspirin 81 MG EC tablet Take 1 tablet (81 mg total) by mouth daily. Swallow whole. 100 tablet 12   CALCIUM PO Take 1,000 mg by mouth.     denosumab (PROLIA) 60 MG/ML SOSY injection Inject 60 mg into the skin every 6 (six) months.     Doxylamine Succinate, Sleep, (SLEEP AID PO) Take by mouth at bedtime.     triamcinolone cream (KENALOG) 0.1 % Apply 1 application topically 2 (two) times daily. 30 g 0   Urea 45 % CREA Use as directed twice per day as needed 255 g 2   VITAMIN D-VITAMIN K PO Take by mouth. Vitamin d 5000     No current facility-administered medications on file prior to visit.        ROS:  All others reviewed and negative.  Objective        PE:  BP 138/86   Pulse 85   Temp 98.1 F (36.7  C) (Oral)   Ht '5\' 5"'$  (1.651 m)   Wt 162 lb 6 oz (73.7 kg)   LMP 09/02/2004   SpO2 95%   BMI 27.02 kg/m                 Constitutional: Pt appears in NAD               HENT: Head: NCAT.                Right Ear: External ear normal.                 Left Ear: External ear normal.                Eyes: . Pupils are equal, round, and reactive to light. Conjunctivae and EOM are normal               Nose: without d/c or deformity               Neck: Neck supple. Gross normal ROM               Cardiovascular: Normal rate and regular rhythm.                 Pulmonary/Chest: Effort normal and breath sounds without rales or wheezing.                Abd:  Soft, NT, ND, + BS, no organomegaly               Neurological: Pt is alert. At baseline orientation, motor grossly intact               Skin: Skin is warm. No rashes, no other new lesions, LE edema - none                Psychiatric: Pt behavior is normal without agitation   Micro: none  Cardiac tracings I have personally interpreted today:  none  Pertinent Radiological findings (summarize): none   Lab Results  Component Value Date   WBC 6.2 03/05/2020   HGB 14.7 03/05/2020   HCT 43.5 03/05/2020   PLT 223.0 03/05/2020   GLUCOSE 85 03/05/2020   CHOL 184 03/05/2020   TRIG 103.0 03/05/2020   HDL 93.20 03/05/2020   LDLDIRECT 103.6 06/01/2012   LDLCALC 70 03/05/2020   ALT 43 (H) 03/05/2020   AST 31 03/05/2020   NA 139 03/05/2020   K 4.1 03/05/2020   CL 103 03/05/2020   CREATININE 0.79 03/05/2020   BUN 18 03/05/2020   CO2 31 03/05/2020   TSH 3.18 03/05/2020   HGBA1C 6.2 03/05/2020   Assessment/Plan:  Casey Conley is a 66 y.o. White or Caucasian [1] female with  has a past medical history of Abnormal Pap smear of cervix, ANEMIA-IRON DEFICIENCY (10/29/2008), Dysuria (03/29/2009), Family history of breast cancer (03/11/2020), Family history of colon cancer (03/11/2020), Fibroid, uterine, History of fibrocystic disease of breast, OSTEOPOROSIS (10/29/2008), Osteoporosis, and Proteinuria.  Vitamin D deficiency Last vitamin D Lab Results  Component Value Date   VD25OH 55.83 09/07/2018   Stable, cont oral replacement   HLD (hyperlipidemia) Lab Results  Component Value Date   LDLCALC 70 03/05/2020   Stable, but pt having muscle aches, and asks for decreased rx, so pt to continue current statin at [facility] 20 qod, cont lower chol diet, f/u rov 3 mo   Blood pressure elevated without history of HTN Borderline today  BP Readings from Last 3 Encounters:  08/28/21 138/86  05/13/21 124/72  10/04/20 138/80   Pt declines med tx for now, for wt control, low salt, excercise  Weight gain Mild to mod overwt, ok for wegovy if ok with insurance  Followup: No follow-ups on file.  Cathlean Cower, MD 08/30/2021 2:37 PM [facility]

## 2021-08-30 NOTE — Assessment & Plan Note (Signed)
Last vitamin D Lab Results  Component Value Date   VD25OH 55.83 09/07/2018   Stable, cont oral replacement

## 2021-08-30 NOTE — Assessment & Plan Note (Signed)
Mild to mod overwt, ok for wegovy if ok with insurance

## 2021-08-30 NOTE — Assessment & Plan Note (Signed)
Lab Results  Component Value Date   LDLCALC 70 03/05/2020   Stable, but pt having muscle aches, and asks for decreased rx, so pt to continue current statin at [facility] 20 qod, cont lower chol diet, f/u rov 3 mo

## 2021-08-30 NOTE — Assessment & Plan Note (Signed)
Borderline today  BP Readings from Last 3 Encounters:  08/28/21 138/86  05/13/21 124/72  10/04/20 138/80   Pt declines med tx for now, for wt control, low salt, excercise

## 2021-09-04 ENCOUNTER — Other Ambulatory Visit (HOSPITAL_COMMUNITY): Payer: Self-pay

## 2021-09-12 ENCOUNTER — Other Ambulatory Visit (HOSPITAL_COMMUNITY): Payer: Self-pay

## 2021-09-29 ENCOUNTER — Encounter: Payer: Self-pay | Admitting: Internal Medicine

## 2021-09-30 MED ORDER — WEGOVY 0.5 MG/0.5ML ~~LOC~~ SOAJ
0.5000 mg | SUBCUTANEOUS | 11 refills | Status: DC
  Filled 2021-10-03: qty 2, 28d supply, fill #0
  Filled 2021-12-20 – 2022-02-16 (×2): qty 2, 28d supply, fill #1
  Filled 2022-03-14: qty 2, 28d supply, fill #2

## 2021-10-02 ENCOUNTER — Telehealth: Payer: Self-pay | Admitting: Internal Medicine

## 2021-10-02 NOTE — Telephone Encounter (Signed)
Pt came in to check on the status of her RX refill of wegovy .'25mg'$ . It was sent to [facility], who is out of the medication and the pt is requesting that we resend the RX to [facility] instead.

## 2021-10-02 NOTE — Telephone Encounter (Signed)
I am communicating with patient through mychart.

## 2021-10-03 ENCOUNTER — Other Ambulatory Visit (HOSPITAL_COMMUNITY): Payer: Self-pay

## 2021-10-03 NOTE — Telephone Encounter (Signed)
Please send new Rx for Wegovy to the [facility] outpatient pharmacy.

## 2021-10-04 ENCOUNTER — Other Ambulatory Visit (HOSPITAL_COMMUNITY): Payer: Self-pay

## 2021-10-10 ENCOUNTER — Other Ambulatory Visit (HOSPITAL_COMMUNITY): Payer: Self-pay

## 2021-10-28 ENCOUNTER — Other Ambulatory Visit (HOSPITAL_COMMUNITY): Payer: Self-pay

## 2021-11-17 ENCOUNTER — Other Ambulatory Visit (HOSPITAL_COMMUNITY): Payer: Self-pay

## 2021-11-19 ENCOUNTER — Other Ambulatory Visit: Payer: Self-pay | Admitting: Internal Medicine

## 2021-11-19 ENCOUNTER — Other Ambulatory Visit (HOSPITAL_COMMUNITY): Payer: Self-pay

## 2021-11-19 MED ORDER — WEGOVY 0.25 MG/0.5ML ~~LOC~~ SOAJ
0.2500 mg | SUBCUTANEOUS | 11 refills | Status: DC
  Filled 2021-11-19: qty 2, 28d supply, fill #0
  Filled 2022-02-19 – 2022-03-10 (×2): qty 2, 28d supply, fill #1

## 2021-11-20 ENCOUNTER — Encounter: Payer: Self-pay | Admitting: Internal Medicine

## 2021-11-21 ENCOUNTER — Other Ambulatory Visit (HOSPITAL_COMMUNITY): Payer: Self-pay

## 2021-11-21 MED ORDER — OZEMPIC (0.25 OR 0.5 MG/DOSE) 2 MG/3ML ~~LOC~~ SOPN
0.2500 mg | PEN_INJECTOR | SUBCUTANEOUS | 3 refills | Status: DC
  Filled 2021-11-21: qty 3, 56d supply, fill #0

## 2021-12-22 ENCOUNTER — Other Ambulatory Visit (HOSPITAL_COMMUNITY): Payer: Self-pay

## 2021-12-24 ENCOUNTER — Other Ambulatory Visit (HOSPITAL_COMMUNITY): Payer: Self-pay

## 2022-01-01 ENCOUNTER — Other Ambulatory Visit (INDEPENDENT_AMBULATORY_CARE_PROVIDER_SITE_OTHER): Payer: Medicare Other

## 2022-01-01 ENCOUNTER — Other Ambulatory Visit: Payer: Self-pay | Admitting: Internal Medicine

## 2022-01-01 ENCOUNTER — Other Ambulatory Visit: Payer: Medicare Other

## 2022-01-01 ENCOUNTER — Encounter: Payer: Self-pay | Admitting: Internal Medicine

## 2022-01-01 DIAGNOSIS — E559 Vitamin D deficiency, unspecified: Secondary | ICD-10-CM | POA: Diagnosis not present

## 2022-01-01 DIAGNOSIS — R739 Hyperglycemia, unspecified: Secondary | ICD-10-CM | POA: Diagnosis not present

## 2022-01-01 DIAGNOSIS — R319 Hematuria, unspecified: Secondary | ICD-10-CM

## 2022-01-01 DIAGNOSIS — E538 Deficiency of other specified B group vitamins: Secondary | ICD-10-CM

## 2022-01-01 DIAGNOSIS — E785 Hyperlipidemia, unspecified: Secondary | ICD-10-CM

## 2022-01-01 LAB — CBC WITH DIFFERENTIAL/PLATELET
Basophils Absolute: 0 10*3/uL (ref 0.0–0.1)
Basophils Relative: 0.6 % (ref 0.0–3.0)
Eosinophils Absolute: 0.2 10*3/uL (ref 0.0–0.7)
Eosinophils Relative: 2.8 % (ref 0.0–5.0)
HCT: 42.6 % (ref 36.0–46.0)
Hemoglobin: 14.4 g/dL (ref 12.0–15.0)
Lymphocytes Relative: 37.7 % (ref 12.0–46.0)
Lymphs Abs: 2.3 10*3/uL (ref 0.7–4.0)
MCHC: 33.9 g/dL (ref 30.0–36.0)
MCV: 86.6 fl (ref 78.0–100.0)
Monocytes Absolute: 0.5 10*3/uL (ref 0.1–1.0)
Monocytes Relative: 7.8 % (ref 3.0–12.0)
Neutro Abs: 3.2 10*3/uL (ref 1.4–7.7)
Neutrophils Relative %: 51.1 % (ref 43.0–77.0)
Platelets: 213 10*3/uL (ref 150.0–400.0)
RBC: 4.92 Mil/uL (ref 3.87–5.11)
RDW: 13.3 % (ref 11.5–15.5)
WBC: 6.2 10*3/uL (ref 4.0–10.5)

## 2022-01-01 LAB — BASIC METABOLIC PANEL
BUN: 22 mg/dL (ref 6–23)
CO2: 29 mEq/L (ref 19–32)
Calcium: 9.9 mg/dL (ref 8.4–10.5)
Chloride: 104 mEq/L (ref 96–112)
Creatinine, Ser: 0.91 mg/dL (ref 0.40–1.20)
GFR: 65.76 mL/min (ref >60.00)
Glucose, Bld: 99 mg/dL (ref 70–99)
Potassium: 3.9 mEq/L (ref 3.5–5.1)
Sodium: 141 mEq/L (ref 135–145)

## 2022-01-01 LAB — URINALYSIS, ROUTINE W REFLEX MICROSCOPIC
Bilirubin Urine: NEGATIVE
Ketones, ur: NEGATIVE
Nitrite: NEGATIVE
Specific Gravity, Urine: 1.02 (ref 1.000–1.030)
Total Protein, Urine: NEGATIVE
Urine Glucose: NEGATIVE
Urobilinogen, UA: 0.2 (ref 0.0–1.0)
pH: 6 (ref 5.0–8.0)

## 2022-01-01 LAB — TSH: TSH: 5.02 u[IU]/mL (ref 0.35–5.50)

## 2022-01-01 LAB — VITAMIN B12: Vitamin B-12: 359 pg/mL (ref 211–911)

## 2022-01-01 LAB — LIPID PANEL
Cholesterol: 146 mg/dL (ref 0–200)
HDL: 89.3 mg/dL (ref >39.00)
LDL Cholesterol: 46 mg/dL (ref 0–99)
NonHDL: 56.72
Total CHOL/HDL Ratio: 2
Triglycerides: 56 mg/dL (ref 0.0–149.0)
VLDL: 11.2 mg/dL (ref 0.0–40.0)

## 2022-01-01 LAB — HEPATIC FUNCTION PANEL
ALT: 21 U/L (ref 0–35)
AST: 24 U/L (ref 0–37)
Albumin: 4.4 g/dL (ref 3.5–5.2)
Alkaline Phosphatase: 60 U/L (ref 39–117)
Bilirubin, Direct: 0.1 mg/dL (ref 0.0–0.3)
Total Bilirubin: 0.7 mg/dL (ref 0.2–1.2)
Total Protein: 7.1 g/dL (ref 6.0–8.3)

## 2022-01-01 LAB — VITAMIN D 25 HYDROXY (VIT D DEFICIENCY, FRACTURES): VITD: 89.25 ng/mL (ref 30.00–100.00)

## 2022-01-01 LAB — HEMOGLOBIN A1C: Hgb A1c MFr Bld: 6.2 % (ref 4.6–6.5)

## 2022-01-02 NOTE — Telephone Encounter (Signed)
Per patient please cancel the above message for a referral to [facility]

## 2022-01-06 ENCOUNTER — Ambulatory Visit (INDEPENDENT_AMBULATORY_CARE_PROVIDER_SITE_OTHER): Payer: Medicare Other | Admitting: Urology

## 2022-01-06 ENCOUNTER — Encounter: Payer: Self-pay | Admitting: Urology

## 2022-01-06 VITALS — BP 143/93 | HR 80 | Ht 65.0 in | Wt 155.0 lb

## 2022-01-06 DIAGNOSIS — R3129 Other microscopic hematuria: Secondary | ICD-10-CM

## 2022-01-06 DIAGNOSIS — R310 Gross hematuria: Secondary | ICD-10-CM

## 2022-01-06 LAB — URINALYSIS
Bilirubin, UA: NEGATIVE
Blood, UA: NEGATIVE
Glucose, UA: NEGATIVE mg/dL
Ketones, UA: NEGATIVE
Leukocytes, UA: NEGATIVE
Nitrite, UA: NEGATIVE
Protein, UA: NEGATIVE
Spec Grav, UA: 1.015 (ref 1.010–1.025)
pH, UA: 7 (ref 5.0–8.0)

## 2022-01-06 NOTE — Progress Notes (Addendum)
Assessment: 1. Microscopic hematuria   2. Gross hematuria     Plan: I reviewed the patient's chart including provider notes and laboratory results. Today I had a discussion with the patient regarding the findings of  gross and microscopic  hematuria including the implications and differential diagnoses associated with it.  I also discussed recommendations for further evaluation including the rationale for upper tract imaging and cystoscopy.  I discussed the nature of these procedures including potential risk and complications.  The patient expressed an understanding of these issues. Schedule for CT hematuria protocol followed by cystoscopy Microscopic U/A ordered today   Chief Complaint:  Chief Complaint  Patient presents with   Hematuria    History of Present Illness:  Casey Conley is a 66 y.o. female who is seen in consultation from Biagio Borg, MD for evaluation of microscopic hematuria.  She noted onset of dark-colored urine on 12/31/2021.  This began after strenuous yard work.  No dysuria or flank pain.  No other urinary symptoms. Urinalysis was from 01/01/2022 showed 0-2 WBCs, TNTC RBCs. She reports that her urine has visibly cleared since that time.  No history of UTIs or kidney stones. No recent imaging. No history of tobacco use.  Past Medical History:  Past Medical History:  Diagnosis Date   Abnormal Pap smear of cervix    12-29-17 neg HPV HR+ 16,18/45 neg   ANEMIA-IRON DEFICIENCY 10/29/2008   Dysuria 03/29/2009   Family history of breast cancer 03/11/2020   Family history of colon cancer 03/11/2020   Fibroid, uterine    History of fibrocystic disease of breast    OSTEOPOROSIS 10/29/2008   Osteoporosis    on Bisphosphonate for aprox 2007   Proteinuria    24 hour-normal    Past Surgical History:  Past Surgical History:  Procedure Laterality Date   BREAST LUMPECTOMY  approx 1990   benign-left   TONGUE SURGERY     had growth removed   UTERINE FIBROID  EMBOLIZATION     s/p    Allergies:  No Known Allergies  Family History:  Family History  Problem Relation Age of Onset   Arthritis Mother    Dementia Mother    Transient ischemic attack Mother    Osteoporosis Mother    Breast cancer Mother        dx 70s, dx 24s   Diabetes Father    Hyperlipidemia Father    Heart disease Father        CAD/CABG   Colon cancer Father 71   Lung cancer Father 27   Diabetes Paternal Grandfather    Heart disease Paternal Grandfather    Cancer Maternal Grandmother 84       unknown type; ? kidney cancer   Uterine cancer Paternal Grandmother        or other "female cancer", dx 60s-70s   Cancer Maternal Uncle        unknown type; ? colon cancer; dx 17s   Cancer Paternal Uncle        sacral chordoma, d. 26   Colon polyps Maternal Uncle        unknown number    Social History:  Social History   Tobacco Use   Smoking status: Never   Smokeless tobacco: Never  Substance Use Topics   Alcohol use: No    Alcohol/week: 0.0 standard drinks of alcohol   Drug use: No    Review of symptoms:  Constitutional:  Negative for unexplained weight loss, night sweats, fever,  chills ENT:  Negative for nose bleeds, sinus pain, painful swallowing CV:  Negative for chest pain, shortness of breath, exercise intolerance, palpitations, loss of consciousness Resp:  Negative for cough, wheezing, shortness of breath GI:  Negative for nausea, vomiting, diarrhea, bloody stools GU:  Positives noted in HPI; otherwise negative for dysuria, urinary incontinence Neuro:  Negative for seizures, poor balance, limb weakness, slurred speech Psych:  Negative for lack of energy, depression, anxiety Endocrine:  Negative for polydipsia, polyuria, symptoms of hypoglycemia (dizziness, hunger, sweating) Hematologic:  Negative for anemia, purpura, petechia, prolonged or excessive bleeding, use of anticoagulants  Allergic:  Negative for difficulty breathing or choking as a result of  exposure to anything; no shellfish allergy; no allergic response (rash/itch) to materials, foods  Physical exam: BP (!) 143/93   Pulse 80   Ht '5\' 5"'$  (1.651 m)   Wt 155 lb (70.3 kg)   LMP 09/02/2004   BMI 25.79 kg/m  GENERAL APPEARANCE:  Well appearing, well developed, well nourished, NAD HEENT: Atraumatic, Normocephalic, oropharynx clear. NECK: Supple without lymphadenopathy or thyromegaly. LUNGS: Clear to auscultation bilaterally. HEART: Regular Rate and Rhythm without murmurs, gallops, or rubs. ABDOMEN: Soft, non-tender, No Masses. EXTREMITIES: Moves all extremities well.  Without clubbing, cyanosis, or edema. NEUROLOGIC:  Alert and oriented x 3, normal gait, CN II-XII grossly intact.  MENTAL STATUS:  Appropriate. BACK:  Non-tender to palpation.  No CVAT SKIN:  Warm, dry and intact.    Results: Results for orders placed or performed in visit on 01/06/22 (from the past 24 hour(s))  Urinalysis     Status: None   Collection Time: 01/06/22 12:00 AM  Result Value Ref Range   Spec Grav, UA 1.015 1.010 - 1.025   Leukocytes, UA Negative Negative   Clarity, UA clear    Protein, UA Negative Negative   Ketones, UA negative    Bilirubin, UA Negative    Blood, UA negative    pH, UA 7.0 5.0 - 8.0   Nitrite, UA Negative    Glucose, UA negative negative mg/dL

## 2022-01-07 LAB — URINALYSIS, MICROSCOPIC ONLY
Bacteria, UA: NONE SEEN
Casts: NONE SEEN /lpf
Epithelial Cells (non renal): NONE SEEN /hpf (ref 0–10)
RBC, Urine: NONE SEEN /hpf (ref 0–2)

## 2022-01-08 ENCOUNTER — Other Ambulatory Visit (HOSPITAL_COMMUNITY): Payer: Self-pay

## 2022-01-09 ENCOUNTER — Ambulatory Visit (INDEPENDENT_AMBULATORY_CARE_PROVIDER_SITE_OTHER): Payer: Medicare Other | Admitting: *Deleted

## 2022-01-09 DIAGNOSIS — Z0000 Encounter for general adult medical examination without abnormal findings: Secondary | ICD-10-CM | POA: Diagnosis not present

## 2022-01-09 NOTE — Patient Instructions (Signed)

## 2022-01-09 NOTE — Progress Notes (Signed)
Subjective:   Casey Conley is a 66 y.o. female who presents for an Initial Medicare Annual Wellness Visit. I connected with  [name] on 01/09/22 by a audio enabled telemedicine application and verified that I am speaking with the correct person using two identifiers.  Patient Location: Home  Provider Location: Home Office  I discussed the limitations of evaluation and management by telemedicine. The patient expressed understanding and agreed to proceed.  Review of Systems    Deferred to PCP Cardiac Risk Factors include: advanced age (>63mn, >>93women)     Objective:    Today's Vitals   01/09/22 1237  PainSc: 2    There is no height or weight on file to calculate BMI.     01/09/2022   12:48 PM  Advanced Directives  Does Patient Have a Medical Advance Directive? Yes    Current Medications (verified) Outpatient Encounter Medications as of 01/09/2022  Medication Sig   aspirin 81 MG EC tablet Take 1 tablet (81 mg total) by mouth daily. Swallow whole.   CALCIUM PO Take 1,000 mg by mouth.   denosumab (PROLIA) 60 MG/ML SOSY injection Inject 60 mg into the skin every 6 (six) months.   Doxylamine Succinate, Sleep, (SLEEP AID PO) Take by mouth at bedtime.   naproxen sodium (ALEVE) 220 MG tablet Take 220 mg by mouth daily as needed.   rosuvastatin (CRESTOR) 20 MG tablet Take 1 tablet (20 mg total) by mouth every other day.   Semaglutide-Weight Management (WEGOVY) 0.25 MG/0.5ML SOAJ Inject 0.25 mg into the skin once a week.   Urea 45 % CREA Use as directed twice per day as needed   VITAMIN D-VITAMIN K PO Take by mouth. Vitamin d 5000   Semaglutide,0.25 or 0.'5MG'$ /DOS, (OZEMPIC, 0.25 OR 0.5 MG/DOSE,) 2 MG/3ML SOPN Inject 0.25 mg into the skin once a week. (Patient not taking: Reported on 01/09/2022)   Semaglutide-Weight Management (WEGOVY) 0.5 MG/0.5ML SOAJ Inject 0.5 mg into the skin once a week. (Patient not taking: Reported on 01/09/2022)   No facility-administered encounter  medications on file as of 01/09/2022.    Allergies (verified) Patient has no known allergies.   History: Past Medical History:  Diagnosis Date   Abnormal Pap smear of cervix    12-29-17 neg HPV HR+ 16,18/45 neg   ANEMIA-IRON DEFICIENCY 10/29/2008   Dysuria 03/29/2009   Family history of breast cancer 03/11/2020   Family history of colon cancer 03/11/2020   Fibroid, uterine    History of fibrocystic disease of breast    OSTEOPOROSIS 10/29/2008   Osteoporosis    on Bisphosphonate for aprox 2007   Proteinuria    24 hour-normal   Past Surgical History:  Procedure Laterality Date   BREAST LUMPECTOMY  approx 1990   benign-left   TONGUE SURGERY     had growth removed   UTERINE FIBROID EMBOLIZATION     s/p   Family History  Problem Relation Age of Onset   Arthritis Mother    Dementia Mother    Transient ischemic attack Mother    Osteoporosis Mother    Breast cancer Mother        dx 680s dx 955s  Diabetes Father    Hyperlipidemia Father    Heart disease Father        CAD/CABG   Colon cancer Father 761  Lung cancer Father 783  Diabetes Paternal Grandfather    Heart disease Paternal Grandfather    Cancer Maternal Grandmother 84  unknown type; ? kidney cancer   Uterine cancer Paternal Grandmother        or other "female cancer", dx 33s-70s   Cancer Maternal Uncle        unknown type; ? colon cancer; dx 90s   Cancer Paternal Uncle        sacral chordoma, d. 44   Colon polyps Maternal Uncle        unknown number   Social History   Socioeconomic History   Marital status: Married    Spouse name: Not on file   Number of children: 0   Years of education: Not on file   Highest education level: Not on file  Occupational History   Occupation: Proofreader  Tobacco Use   Smoking status: Never   Smokeless tobacco: Never  Vaping Use   Vaping Use: Never used  Substance and Sexual Activity   Alcohol use: No    Alcohol/week: 0.0 standard drinks  of alcohol   Drug use: No   Sexual activity: Not Currently    Partners: Male    Birth control/protection: Post-menopausal  Other Topics Concern   Not on file  Social History Narrative   No siblings    Social Determinants of Health   Financial Resource Strain: Low Risk  (01/09/2022)   Overall Financial Resource Strain (CARDIA)    Difficulty of Paying Living Expenses: Not hard at all  Food Insecurity: No Food Insecurity (01/09/2022)   Hunger Vital Sign    Worried About Running Out of Food in the Last Year: Never true    [place] in the Last Year: Never true  Transportation Needs: No Transportation Needs (01/09/2022)   PRAPARE - Hydrologist (Medical): No    Lack of Transportation (Non-Medical): No  Physical Activity: Insufficiently Active (01/09/2022)   Exercise Vital Sign    Days of Exercise per Week: 4 days    Minutes of Exercise per Session: 30 min  Stress: No Stress Concern Present (01/09/2022)   [place]    Feeling of Stress : Not at all  Social Connections: [place] (01/09/2022)   Social Connection and Isolation Panel [NHANES]    Frequency of Communication with Friends and Family: More than three times a week    Frequency of Social Gatherings with Friends and Family: More than three times a week    Attends Religious Services: More than 4 times per year    Active Member of [employer] or Organizations: Yes    Attends Music therapist: More than 4 times per year    Marital Status: Married    Tobacco Counseling Counseling given: Not Answered   Clinical Intake:  Pre-visit preparation completed: Yes  Pain : 0-10 Pain Score: 2  Pain Type: Chronic pain Pain Location: Knee Pain Orientation: Right Pain Descriptors / Indicators: Aching, Discomfort, Dull Pain Relieving Factors: PT and medication as needed  Pain Relieving Factors: PT and medication as  needed  Diabetes: No  How often do you need to have someone help you when you read instructions, pamphlets, or other written materials from your doctor or pharmacy?: 1 - Never  Diabetic?No  Interpreter Needed?: No  Information entered by :: Emelia Loron RN   Activities of Daily Living    01/09/2022   12:45 PM  In your present state of health, do you have any difficulty performing the following activities:  Hearing? 0  Vision? 0  Difficulty concentrating or making decisions? 0  Walking or climbing stairs? 0  Dressing or bathing? 0  Doing errands, shopping? 0  Preparing Food and eating ? N  Using the Toilet? N  In the past six months, have you accidently leaked urine? N  Do you have problems with loss of bowel control? N  Managing your Medications? N  Managing your Finances? N  Housekeeping or managing your Housekeeping? N    Patient Care Team: Biagio Borg, MD as PCP - General  Indicate any recent Medical Services you may have received from other than [facility] providers in the past year (date may be approximate).     Assessment:   This is a routine wellness examination for Tamaya.  Hearing/Vision screen No results found.  Dietary issues and exercise activities discussed: Current Exercise Habits: Home exercise routine, Type of exercise: walking;strength training/weights, Time (Minutes): 45, Frequency (Times/Week): 5, Weekly Exercise (Minutes/Week): 225, Intensity: Mild, Exercise limited by: orthopedic condition(s)   Goals Addressed             This Visit's Progress    Patient Stated       Continue to stay as healthy as possible by eating healthy, exercising and being proactive.      Depression Screen    01/09/2022   12:43 PM 08/28/2021    3:50 PM 03/12/2020    1:13 PM 09/07/2018    4:21 PM 02/16/2017    3:58 PM 01/18/2015    5:28 PM  PHQ 2/9 Scores  PHQ - 2 Score 0 0 0 1 0 0  PHQ- 9 Score  3        Fall Risk    01/09/2022   12:48 PM 08/28/2021    3:50 PM  03/12/2020    1:13 PM 09/07/2018    4:21 PM 02/16/2017    3:58 PM  [place] in the past year? 0 0 0 0 No  Number falls in past yr: 0 0 0    Injury with Fall? 0 0 0    Risk for fall due to : No Fall Risks No Fall Risks     Follow up Falls evaluation completed Falls evaluation completed       [place]:  Any stairs in or around the home? Yes  If so, are there any without handrails? Yes  Home free of loose throw rugs in walkways, pet beds, electrical cords, etc? Yes  Adequate lighting in your home to reduce risk of falls? Yes   ASSISTIVE DEVICES UTILIZED TO PREVENT FALLS:  Life alert? No  Use of a cane, walker or w/c? No  Grab bars in the bathroom? No  Shower chair or bench in shower? No  Elevated toilet seat or a handicapped toilet? No   Cognitive Function:        01/09/2022   12:48 PM  6CIT Screen  What Year? 0 points  What month? 0 points  What time? 0 points  Count back from 20 0 points  Months in reverse 0 points  Repeat phrase 0 points  Total Score 0 points    Immunizations Immunization History  Administered Date(s) Administered   DTaP 02/02/2006   Fluad Quad(high Dose 65+) 12/20/2020   Influenza Inj Mdck Quad Pf 11/29/2017   Influenza Split 01/16/2011, 01/12/2012   Influenza Whole 10/29/2008   Influenza, High Dose Seasonal PF 11/03/2018   Influenza,inj,Quad PF,6+ Mos 12/21/2012, 12/19/2014   Influenza-Unspecified  12/04/2019   PFIZER Comirnaty(Gray Top)Covid-19 Tri-Sucrose Vaccine 04/03/2019, 05/08/2019, 12/04/2019   PNEUMOCOCCAL CONJUGATE-20 08/28/2021   Td 02/02/2005   Tdap 02/16/2017   Zoster Recombinat (Shingrix) 09/02/2017, 01/06/2018    TDAP status: Up to date  Flu Vaccine status: Due, Education has been provided regarding the importance of this vaccine. Advised may receive this vaccine at local pharmacy or Health Dept. Aware to provide a copy of the vaccination record if obtained from local pharmacy or  Health Dept. Verbalized acceptance and understanding.  Pneumococcal vaccine status: Up to date  Covid-19 vaccine status: Information provided on how to obtain vaccines.   Qualifies for Shingles Vaccine? Yes   Zostavax completed No   Shingrix Completed?: Yes  Screening Tests Health Maintenance  Topic Date Due   COLONOSCOPY (Pts 45-9yr Insurance coverage will need to be confirmed)  06/02/2021   COVID-19 Vaccine (4 - 2023-24 season) 10/03/2021   INFLUENZA VACCINE  05/03/2022 (Originally 09/02/2021)   Medicare Annual Wellness (AWV)  01/10/2023   MAMMOGRAM  02/19/2023   DTaP/Tdap/Td (4 - Td or Tdap) 02/17/2027   Pneumonia Vaccine 66 Years old  Completed   DEXA SCAN  Completed   Hepatitis C Screening  Completed   Zoster Vaccines- Shingrix  Completed   HPV VACCINES  Aged Out    Health Maintenance  Health Maintenance Due  Topic Date Due   COLONOSCOPY (Pts 45-452yrInsurance coverage will need to be confirmed)  06/02/2021   COVID-19 Vaccine (4 - 2023-24 season) 10/03/2021    Colorectal Cancer Screening: [facility] Completed 02/18/21. Repeat every year  Bone Density status: Completed 07/04/18. Results reflect: Bone density results: OSTEOPOROSIS. Repeat every 2 years. [facility]; states she has improved to osteopenia.  Lung Cancer Screening: (Low Dose CT Chest recommended if Age 66-80ears, 30 pack-year currently smoking OR have quit w/in 15years.) does not qualify.   Additional Screening:  Hepatitis C Screening: does qualify; Completed 07/04/15  Vision Screening: Recommended annual ophthalmology exams for early detection of glaucoma and other disorders of the eye. Is the patient up to date with their annual eye exam?  Yes  Who is the provider or what is the name of the office in which the patient attends annual eye exams? Reports  she goes yearly not sure of facility name If pt is not established with a provider, would they like to be referred to a provider to establish care?  N/A .   Dental Screening: Recommended annual dental exams for proper oral hygiene  Community Resource Referral / Chronic Care Management: CRR required this visit?  No   CCM required this visit?  No      Plan:     I have personally reviewed and noted the following in the patient's chart:   Medical and social history Use of alcohol, tobacco or illicit drugs  Current medications and supplements including opioid prescriptions. Patient is not currently taking opioid prescriptions. Functional ability and status Nutritional status Physical activity Advanced directives List of other physicians Hospitalizations, surgeries, and ER visits in previous 12 months Vitals Screenings to include cognitive, depression, and falls Referrals and appointments  In addition, I have reviewed and discussed with patient certain preventive protocols, quality metrics, and best practice recommendations. A written personalized care plan for preventive services as well as general preventive health recommendations were provided to patient.     JiSharee Pimple  A Chloeann Alfred, RN   01/09/2022   Nurse Notes:  Ms. Gouger , Thank you for taking time to come for your Medicare Wellness Visit. I appreciate your ongoing commitment to your health goals. Please review the following plan we discussed and let me know if I can assist you in the future.   These are the goals we discussed:  Goals      Patient Stated     Continue to stay as healthy as possible by eating healthy, exercising and being proactive.        This is a list of the screening recommended for you and due dates:  Health Maintenance  Topic Date Due   Colon Cancer Screening  06/02/2021   COVID-19 Vaccine (4 - 2023-24 season) 10/03/2021   Flu Shot  05/03/2022*   Medicare Annual Wellness Visit  01/10/2023   Mammogram   02/19/2023   DTaP/Tdap/Td vaccine (4 - Td or Tdap) 02/17/2027   Pneumonia Vaccine  Completed   DEXA scan (bone density measurement)  Completed   Hepatitis C Screening: USPSTF Recommendation to screen - Ages 94-79 yo.  Completed   Zoster (Shingles) Vaccine  Completed   HPV Vaccine  Aged Out  *Topic was postponed. The date shown is not the original due date.

## 2022-01-16 ENCOUNTER — Ambulatory Visit: Payer: Medicare Other

## 2022-01-19 ENCOUNTER — Ambulatory Visit (HOSPITAL_BASED_OUTPATIENT_CLINIC_OR_DEPARTMENT_OTHER)
Admission: RE | Admit: 2022-01-19 | Discharge: 2022-01-19 | Disposition: A | Discharge status: Home / Self Care | Payer: Medicare Other | Source: Ambulatory Visit | Attending: Urology | Admitting: Urology

## 2022-01-19 ENCOUNTER — Ambulatory Visit (HOSPITAL_BASED_OUTPATIENT_CLINIC_OR_DEPARTMENT_OTHER): Payer: Medicare Other

## 2022-01-19 DIAGNOSIS — R3129 Other microscopic hematuria: Secondary | ICD-10-CM | POA: Insufficient documentation

## 2022-01-19 DIAGNOSIS — R310 Gross hematuria: Secondary | ICD-10-CM | POA: Insufficient documentation

## 2022-01-19 MED ORDER — IOHEXOL 300 MG/ML  SOLN
125.0000 mL | Freq: Once | INTRAMUSCULAR | Status: AC | PRN
  Administered 2022-01-19: 125 mL via INTRAVENOUS

## 2022-01-28 ENCOUNTER — Other Ambulatory Visit (HOSPITAL_COMMUNITY): Payer: Self-pay

## 2022-01-29 ENCOUNTER — Encounter: Payer: Self-pay | Admitting: Urology

## 2022-02-04 ENCOUNTER — Other Ambulatory Visit (HOSPITAL_COMMUNITY): Payer: Self-pay

## 2022-02-09 ENCOUNTER — Encounter: Payer: Self-pay | Admitting: Urology

## 2022-02-09 ENCOUNTER — Other Ambulatory Visit: Payer: Self-pay | Admitting: Urology

## 2022-02-09 ENCOUNTER — Ambulatory Visit (INDEPENDENT_AMBULATORY_CARE_PROVIDER_SITE_OTHER): Payer: Medicare Other | Admitting: Urology

## 2022-02-09 ENCOUNTER — Ambulatory Visit (HOSPITAL_BASED_OUTPATIENT_CLINIC_OR_DEPARTMENT_OTHER)
Admission: RE | Admit: 2022-02-09 | Discharge: 2022-02-09 | Disposition: A | Discharge status: Home / Self Care | Payer: Medicare Other | Source: Ambulatory Visit | Attending: Urology | Admitting: Urology

## 2022-02-09 VITALS — BP 131/82 | HR 90 | Ht 65.5 in | Wt 158.0 lb

## 2022-02-09 DIAGNOSIS — R3129 Other microscopic hematuria: Secondary | ICD-10-CM | POA: Diagnosis not present

## 2022-02-09 DIAGNOSIS — N201 Calculus of ureter: Secondary | ICD-10-CM | POA: Diagnosis not present

## 2022-02-09 DIAGNOSIS — N200 Calculus of kidney: Secondary | ICD-10-CM | POA: Diagnosis present

## 2022-02-09 DIAGNOSIS — R310 Gross hematuria: Secondary | ICD-10-CM

## 2022-02-09 LAB — URINALYSIS
Bilirubin, UA: NEGATIVE
Glucose, UA: NEGATIVE mg/dL
Ketones, POC UA: NEGATIVE mg/dL
Leukocytes, UA: NEGATIVE
Nitrite, UA: NEGATIVE
Protein, UA: NEGATIVE
Spec Grav, UA: 1.02 (ref 1.010–1.025)
Urobilinogen, UA: 0.2 E.U./dL
pH, UA: 6.5 (ref 5.0–8.0)

## 2022-02-09 MED ORDER — CIPROFLOXACIN HCL 500 MG PO TABS
500.0000 mg | ORAL_TABLET | Freq: Once | ORAL | Status: AC
  Administered 2022-02-09: 500 mg via ORAL

## 2022-02-09 NOTE — Progress Notes (Unsigned)
Surgical Physician Order Form [facility]  * Scheduling expectation :  02/16/22  *Length of Case: 60 minutes  *MD Preforming Case: Michaelle Birks, MD  *Assistant Needed: no  *Facility Preference:  [facility]  *Clearance needed: no  *Anticoagulation Instructions: N/A  *Aspirin Instructions: Hold Aspirin  -Admit type: OUTpatient  -Anesthesia: Local  -Use Standing Orders: ESWL  *Diagnosis: Right UPJ Stone  *Procedure: right  ESL  Additional orders: N/A  -Equipment:  None -VTE Prophylaxis Standing Order SCD's       Other:   -Standing Lab Orders Per Anesthesia    Lab other: KUB day of Procedure  -Standing Test orders EKG/Chest x-ray per Anesthesia       Test other:   - Medications:   None  -Other orders:  N/A  *Post-op visit Date/Instructions:  1-2 week with KUB prior

## 2022-02-09 NOTE — H&P (View-Only) (Signed)
Assessment: 1. Microscopic hematuria   2. Gross hematuria   3. Nephrolithiasis     Plan: I personally reviewed the CT study from 01/20/2022 with results as noted below. I also reviewed the KUB study from today showing a 5 mm calcification at the right L4 transverse process consistent with the proximal ureteral calculus seen on CT imaging. I discussed the findings on cystoscopy with the patient. Urine sent for cytology. Cipro x 1 following cystoscopy. Options for management of the right proximal ureteral calculus discussed with the patient today including spontaneous passage, shockwave lithotripsy, and ureteroscopic stone manipulation.  I recommended treatment as the stone does not appear to have migrated distally since her CT was done approximately 3 weeks ago.  Following our discussion, she would like to proceed with shockwave lithotripsy. I discussed holding her aspirin with her PCP, Dr. Jenny Reichmann, who approved pretreatment hold of her aspirin.  Procedure: The patient will be scheduled for Right ESL at [facility].  Surgical request is placed with the surgery schedulers and will be scheduled at the patient's/family request. Informed consent is given as documented below. Anesthesia:  local  The patient does not have sleep apnea, history of MRSA, history of VRE, history of cardiac device requiring special anesthetic needs. Patient is stable and considered clear for surgical in an outpatient ambulatory surgery setting as well as patient hospital setting.  Consent for Operation or Procedure: Provider Certification I hereby certify that the nature, purpose, benefits, usual and most frequent risks of, and alternatives to, the operation or procedure have been explained to the patient (or person authorized to sign for the patient) either by me as responsible physician or by the provider who is to perform the operation or procedure. Time spent such that the patient/family has had an opportunity to ask  questions, and that those questions have been answered. The patient or the patient's representative has been advised that selected tasks may be performed by assistants to the primary health care provider(s). I believe that the patient (or person authorized to sign for the patient) understands what has been explained, and has consented to the operation or procedure. No guarantees were implied or made.   Chief Complaint:  Chief Complaint  Patient presents with   Cysto    History of Present Illness:  Casey Conley is a 67 y.o. female who is seen for further evaluation of gross and microscopic hematuria.  She noted onset of dark-colored urine on 12/31/2021.  This began after strenuous yard work.  No dysuria or flank pain.  No other urinary symptoms. Urinalysis was from 01/01/2022 showed 0-2 WBCs, TNTC RBCs. She reported that her urine has visibly cleared since that time.  No history of UTIs or kidney stones. No recent imaging. No history of tobacco use. Urinalysis from 01/06/2022 showed TNTC RBCs. CT hematuria protocol from 01/20/2022 showed approximately 10 stones in the right kidney, mild right-sided hydronephrosis secondary to a 4 x 5 x 7 mm stone at the right UPJ, numerous stones in the left kidney, no left hydronephrosis, 4.9 cm simple cyst upper pole right kidney.  She presents today for evaluation with cystoscopy.  She reports 2 episodes of gross hematuria since her last visit.  The most recent episode occurred yesterday.  These were again associated with more strenuous activity.  She is not having any flank pain.  No dysuria.  Portions of the above documentation were copied from a prior visit for review purposes only.   Past Medical History:  Past Medical  History:  Diagnosis Date   Abnormal Pap smear of cervix    12-29-17 neg HPV HR+ 16,18/45 neg   ANEMIA-IRON DEFICIENCY 10/29/2008   Dysuria 03/29/2009   Family history of breast cancer 03/11/2020   Family history of colon cancer  03/11/2020   Fibroid, uterine    History of fibrocystic disease of breast    OSTEOPOROSIS 10/29/2008   Osteoporosis    on Bisphosphonate for aprox 2007   Proteinuria    24 hour-normal    Past Surgical History:  Past Surgical History:  Procedure Laterality Date   BREAST LUMPECTOMY  approx 1990   benign-left   TONGUE SURGERY     had growth removed   UTERINE FIBROID EMBOLIZATION     s/p    Allergies:  No Known Allergies  Family History:  Family History  Problem Relation Age of Onset   Arthritis Mother    Dementia Mother    Transient ischemic attack Mother    Osteoporosis Mother    Breast cancer Mother        dx 43s, dx 61s   Diabetes Father    Hyperlipidemia Father    Heart disease Father        CAD/CABG   Colon cancer Father 59   Lung cancer Father 55   Diabetes Paternal Grandfather    Heart disease Paternal Grandfather    Cancer Maternal Grandmother 84       unknown type; ? kidney cancer   Uterine cancer Paternal Grandmother        or other "female cancer", dx 60s-70s   Cancer Maternal Uncle        unknown type; ? colon cancer; dx 8s   Cancer Paternal Uncle        sacral chordoma, d. 64   Colon polyps Maternal Uncle        unknown number    Social History:  Social History   Tobacco Use   Smoking status: Never   Smokeless tobacco: Never  Vaping Use   Vaping Use: Never used  Substance Use Topics   Alcohol use: No    Alcohol/week: 0.0 standard drinks of alcohol   Drug use: No    ROS: Constitutional:  Negative for fever, chills, weight loss CV: Negative for chest pain, previous MI, hypertension Respiratory:  Negative for shortness of breath, wheezing, sleep apnea, frequent cough GI:  Negative for nausea, vomiting, bloody stool, GERD  Physical exam: BP 131/82   Pulse 90   Ht 5' 5.5" (1.664 m)   Wt 158 lb (71.7 kg)   LMP 09/02/2004   BMI 25.89 kg/m  GENERAL APPEARANCE:  Well appearing, well developed, well nourished, NAD HEENT:  Atraumatic,  normocephalic, oropharynx clear NECK:  Supple without lymphadenopathy or thyromegaly ABDOMEN:  Soft, non-tender, no masses EXTREMITIES:  Moves all extremities well, without clubbing, cyanosis, or edema NEUROLOGIC:  Alert and oriented x 3, normal gait, CN II-XII grossly intact MENTAL STATUS:  appropriate BACK:  Non-tender to palpation, No CVAT SKIN:  Warm, dry, and intact  Results: U/A dipstick: 2+ blood  CYSTOSCOPY  Procedure: Flexible cystoscopy  Pre-Operative Diagnosis:  Gross hematuria  Post-Operative Diagnosis: Gross hematuria  Anesthesia: local with lidocaine gel  Surgical Narrative:  After appropriate informed consent was obtained, the patient was prepped and draped in the usual sterile fashion in the supine position. She was correctly identified and the proper procedure delineated prior to proceeding. Sterile lidocaine gel was instilled in the urethra.  The flexible cystoscope was introduced without  difficulty.  Findings:  Urethra: Normal  Bladder: Normal  Ureteral orifices: normal  Additional findings: None  A bladder wash was obtained for cytology.  Stress urinary incontinence was not present with cough valsalva. Pelvic exam was not positive for pelvic prolapse.  She tolerated the procedure well.  A chaperone was present throughout the procedure.

## 2022-02-09 NOTE — Progress Notes (Signed)
Assessment: 1. Microscopic hematuria   2. Gross hematuria   3. Nephrolithiasis     Plan: I personally reviewed the CT study from 01/20/2022 with results as noted below. I also reviewed the KUB study from today showing a 5 mm calcification at the right L4 transverse process consistent with the proximal ureteral calculus seen on CT imaging. I discussed the findings on cystoscopy with the patient. Urine sent for cytology. Cipro x 1 following cystoscopy. Options for management of the right proximal ureteral calculus discussed with the patient today including spontaneous passage, shockwave lithotripsy, and ureteroscopic stone manipulation.  I recommended treatment as the stone does not appear to have migrated distally since her CT was done approximately 3 weeks ago.  Following our discussion, she would like to proceed with shockwave lithotripsy. I discussed holding her aspirin with her PCP, Dr. Jenny Reichmann, who approved pretreatment hold of her aspirin.  Procedure: The patient will be scheduled for Right ESL at [facility].  Surgical request is placed with the surgery schedulers and will be scheduled at the patient's/family request. Informed consent is given as documented below. Anesthesia:  local  The patient does not have sleep apnea, history of MRSA, history of VRE, history of cardiac device requiring special anesthetic needs. Patient is stable and considered clear for surgical in an outpatient ambulatory surgery setting as well as patient hospital setting.  Consent for Operation or Procedure: Provider Certification I hereby certify that the nature, purpose, benefits, usual and most frequent risks of, and alternatives to, the operation or procedure have been explained to the patient (or person authorized to sign for the patient) either by me as responsible physician or by the provider who is to perform the operation or procedure. Time spent such that the patient/family has had an opportunity to ask  questions, and that those questions have been answered. The patient or the patient's representative has been advised that selected tasks may be performed by assistants to the primary health care provider(s). I believe that the patient (or person authorized to sign for the patient) understands what has been explained, and has consented to the operation or procedure. No guarantees were implied or made.   Chief Complaint:  Chief Complaint  Patient presents with   Cysto    History of Present Illness:  Casey Conley is a 67 y.o. female who is seen for further evaluation of gross and microscopic hematuria.  She noted onset of dark-colored urine on 12/31/2021.  This began after strenuous yard work.  No dysuria or flank pain.  No other urinary symptoms. Urinalysis was from 01/01/2022 showed 0-2 WBCs, TNTC RBCs. She reported that her urine has visibly cleared since that time.  No history of UTIs or kidney stones. No recent imaging. No history of tobacco use. Urinalysis from 01/06/2022 showed TNTC RBCs. CT hematuria protocol from 01/20/2022 showed approximately 10 stones in the right kidney, mild right-sided hydronephrosis secondary to a 4 x 5 x 7 mm stone at the right UPJ, numerous stones in the left kidney, no left hydronephrosis, 4.9 cm simple cyst upper pole right kidney.  She presents today for evaluation with cystoscopy.  She reports 2 episodes of gross hematuria since her last visit.  The most recent episode occurred yesterday.  These were again associated with more strenuous activity.  She is not having any flank pain.  No dysuria.  Portions of the above documentation were copied from a prior visit for review purposes only.   Past Medical History:  Past Medical  History:  Diagnosis Date   Abnormal Pap smear of cervix    12-29-17 neg HPV HR+ 16,18/45 neg   ANEMIA-IRON DEFICIENCY 10/29/2008   Dysuria 03/29/2009   Family history of breast cancer 03/11/2020   Family history of colon cancer  03/11/2020   Fibroid, uterine    History of fibrocystic disease of breast    OSTEOPOROSIS 10/29/2008   Osteoporosis    on Bisphosphonate for aprox 2007   Proteinuria    24 hour-normal    Past Surgical History:  Past Surgical History:  Procedure Laterality Date   BREAST LUMPECTOMY  approx 1990   benign-left   TONGUE SURGERY     had growth removed   UTERINE FIBROID EMBOLIZATION     s/p    Allergies:  No Known Allergies  Family History:  Family History  Problem Relation Age of Onset   Arthritis Mother    Dementia Mother    Transient ischemic attack Mother    Osteoporosis Mother    Breast cancer Mother        dx 38s, dx 32s   Diabetes Father    Hyperlipidemia Father    Heart disease Father        CAD/CABG   Colon cancer Father 63   Lung cancer Father 45   Diabetes Paternal Grandfather    Heart disease Paternal Grandfather    Cancer Maternal Grandmother 84       unknown type; ? kidney cancer   Uterine cancer Paternal Grandmother        or other "female cancer", dx 60s-70s   Cancer Maternal Uncle        unknown type; ? colon cancer; dx 51s   Cancer Paternal Uncle        sacral chordoma, d. 42   Colon polyps Maternal Uncle        unknown number    Social History:  Social History   Tobacco Use   Smoking status: Never   Smokeless tobacco: Never  Vaping Use   Vaping Use: Never used  Substance Use Topics   Alcohol use: No    Alcohol/week: 0.0 standard drinks of alcohol   Drug use: No    ROS: Constitutional:  Negative for fever, chills, weight loss CV: Negative for chest pain, previous MI, hypertension Respiratory:  Negative for shortness of breath, wheezing, sleep apnea, frequent cough GI:  Negative for nausea, vomiting, bloody stool, GERD  Physical exam: BP 131/82   Pulse 90   Ht 5' 5.5" (1.664 m)   Wt 158 lb (71.7 kg)   LMP 09/02/2004   BMI 25.89 kg/m  GENERAL APPEARANCE:  Well appearing, well developed, well nourished, NAD HEENT:  Atraumatic,  normocephalic, oropharynx clear NECK:  Supple without lymphadenopathy or thyromegaly ABDOMEN:  Soft, non-tender, no masses EXTREMITIES:  Moves all extremities well, without clubbing, cyanosis, or edema NEUROLOGIC:  Alert and oriented x 3, normal gait, CN II-XII grossly intact MENTAL STATUS:  appropriate BACK:  Non-tender to palpation, No CVAT SKIN:  Warm, dry, and intact  Results: U/A dipstick: 2+ blood  CYSTOSCOPY  Procedure: Flexible cystoscopy  Pre-Operative Diagnosis:  Gross hematuria  Post-Operative Diagnosis: Gross hematuria  Anesthesia: local with lidocaine gel  Surgical Narrative:  After appropriate informed consent was obtained, the patient was prepped and draped in the usual sterile fashion in the supine position. She was correctly identified and the proper procedure delineated prior to proceeding. Sterile lidocaine gel was instilled in the urethra.  The flexible cystoscope was introduced without  difficulty.  Findings:  Urethra: Normal  Bladder: Normal  Ureteral orifices: normal  Additional findings: None  A bladder wash was obtained for cytology.  Stress urinary incontinence was not present with cough valsalva. Pelvic exam was not positive for pelvic prolapse.  She tolerated the procedure well.  A chaperone was present throughout the procedure.

## 2022-02-09 NOTE — Progress Notes (Signed)
I spoke with Casey Conley. We have discussed possible surgery dates and 02/16/2022 was agreed upon by all parties. Patient given information about surgery date, what to expect pre-operatively and post operatively.    We discussed that a pre-op nurse will be calling to set up the pre-op visit that will take place prior to surgery. Informed patient that our office will communicate any additional care to be provided after surgery.    Patients questions or concerns were discussed during our call. Advised to call our office should there be any additional information, questions or concerns that arise. Patient verbalized understanding.

## 2022-02-11 NOTE — Progress Notes (Signed)
Pre-op phone call for lithotripsy procedure on Monday 02/16/22. Patient advised to bring Davan[place] folder day of procedure. Reviewed allergy list and medication list.  Reviewed medical history and gyn status. Patient denies shortness of breath, home oxygen use, and sleep apnea. Advised to take laxative of choice the evening prior to procedure. No clear liquids after 5:45 am. Driver: husband Genevia Bouldin, [phone] will be able to stay with patient for 24 hours. Patient instructed not to take 81 mg aspirin for 72 hours prior to procedure.  Patient instructed not to take ibuprofen products for 48 hours prior to procedure. Patient states she has never taken Ozempic, and has not taken [facility] since 12/23. Patient arrival time 1145 am, directions given to [facility].

## 2022-02-11 NOTE — Addendum Note (Signed)
Addended by: Evelina Bucy on: 02/11/2022 10:53 AM   Modules accepted: Orders

## 2022-02-13 NOTE — Addendum Note (Signed)
Addended by: Evelina Bucy on: 02/13/2022 01:50 PM   Modules accepted: Orders

## 2022-02-16 ENCOUNTER — Ambulatory Visit (HOSPITAL_BASED_OUTPATIENT_CLINIC_OR_DEPARTMENT_OTHER)
Admission: RE | Admit: 2022-02-16 | Discharge: 2022-02-16 | Disposition: A | Discharge status: Home / Self Care | Payer: Medicare Other | Attending: Urology | Admitting: Urology

## 2022-02-16 ENCOUNTER — Ambulatory Visit (HOSPITAL_COMMUNITY): Payer: Medicare Other

## 2022-02-16 ENCOUNTER — Encounter: Payer: Self-pay | Admitting: Urology

## 2022-02-16 ENCOUNTER — Other Ambulatory Visit (HOSPITAL_COMMUNITY): Payer: Self-pay

## 2022-02-16 ENCOUNTER — Other Ambulatory Visit: Payer: Self-pay

## 2022-02-16 ENCOUNTER — Encounter (HOSPITAL_BASED_OUTPATIENT_CLINIC_OR_DEPARTMENT_OTHER)
Admission: RE | Disposition: A | Discharge status: Home / Self Care | Payer: Self-pay | Source: Home / Self Care | Attending: Urology

## 2022-02-16 ENCOUNTER — Other Ambulatory Visit: Payer: Self-pay | Admitting: Urology

## 2022-02-16 ENCOUNTER — Encounter (HOSPITAL_BASED_OUTPATIENT_CLINIC_OR_DEPARTMENT_OTHER): Payer: Self-pay | Admitting: Urology

## 2022-02-16 DIAGNOSIS — N132 Hydronephrosis with renal and ureteral calculous obstruction: Secondary | ICD-10-CM | POA: Insufficient documentation

## 2022-02-16 DIAGNOSIS — N200 Calculus of kidney: Secondary | ICD-10-CM

## 2022-02-16 DIAGNOSIS — N201 Calculus of ureter: Secondary | ICD-10-CM

## 2022-02-16 HISTORY — PX: EXTRACORPOREAL SHOCK WAVE LITHOTRIPSY: SHX1557

## 2022-02-16 SURGERY — LITHOTRIPSY, ESWL
Anesthesia: LOCAL | Laterality: Right

## 2022-02-16 MED ORDER — HYDROCODONE-ACETAMINOPHEN 5-325 MG PO TABS
1.0000 | ORAL_TABLET | Freq: Four times a day (QID) | ORAL | 0 refills | Status: DC | PRN
  Filled 2022-02-16: qty 15, 4d supply, fill #0

## 2022-02-16 MED ORDER — HYDROCODONE-ACETAMINOPHEN 5-325 MG PO TABS: 1.0000 | ORAL_TABLET | Freq: Once | ORAL | Status: DC

## 2022-02-16 MED ORDER — DIPHENHYDRAMINE HCL 25 MG PO CAPS
25.0000 mg | ORAL_CAPSULE | ORAL | Status: AC
  Administered 2022-02-16: 25 mg via ORAL

## 2022-02-16 MED ORDER — DIPHENHYDRAMINE HCL 25 MG PO CAPS
ORAL_CAPSULE | ORAL | Status: AC
  Filled 2022-02-16: qty 1

## 2022-02-16 MED ORDER — SODIUM CHLORIDE 0.9 % IV SOLN: INTRAVENOUS | Status: DC

## 2022-02-16 MED ORDER — DIAZEPAM 5 MG PO TABS
10.0000 mg | ORAL_TABLET | ORAL | Status: AC
  Administered 2022-02-16: 10 mg via ORAL

## 2022-02-16 MED ORDER — DIAZEPAM 5 MG PO TABS
ORAL_TABLET | ORAL | Status: AC
  Filled 2022-02-16: qty 2

## 2022-02-16 MED ORDER — TAMSULOSIN HCL 0.4 MG PO CAPS
0.4000 mg | ORAL_CAPSULE | Freq: Every day | ORAL | 0 refills | Status: DC
  Filled 2022-02-16: qty 14, 14d supply, fill #0

## 2022-02-16 NOTE — Discharge Instructions (Signed)
  Post Anesthesia Home Care Instructions ° °Activity: °Get plenty of rest for the remainder of the day. A responsible adult should stay with you for 24 hours following the procedure.  °For the next 24 hours, DO NOT: °-Drive a car °-Operate machinery °-Drink alcoholic beverages °-Take any medication unless instructed by your physician °-Make any legal decisions or sign important papers. ° °Meals: °Start with liquid foods such as gelatin or soup. Progress to regular foods as tolerated. Avoid greasy, spicy, heavy foods. If nausea and/or vomiting occur, drink only clear liquids until the nausea and/or vomiting subsides. Call your physician if vomiting continues. ° °Special Instructions/Symptoms: °Your throat may feel dry or sore from the anesthesia or the breathing tube placed in your throat during surgery. If this causes discomfort, gargle with warm salt water. The discomfort should disappear within 24 hours. ° °

## 2022-02-16 NOTE — Interval H&P Note (Signed)
History and Physical Interval Note:  02/16/2022 1:06 PM  Casey Conley  has presented today for surgery, with the diagnosis of Right UPJ Stone.  The various methods of treatment have been discussed with the patient and family. After consideration of risks, benefits and other options for treatment, the patient has consented to  Procedure(s): EXTRACORPOREAL SHOCK WAVE LITHOTRIPSY (ESWL) (Right) as a surgical intervention.  The patient's history has been reviewed, patient examined, no change in status, stable for surgery.  I have reviewed the patient's chart and labs.  Questions were answered to the patient's satisfaction.     Michaelle Birks

## 2022-02-17 ENCOUNTER — Encounter (HOSPITAL_BASED_OUTPATIENT_CLINIC_OR_DEPARTMENT_OTHER): Payer: Self-pay | Admitting: Urology

## 2022-02-18 ENCOUNTER — Other Ambulatory Visit: Payer: Self-pay

## 2022-02-19 ENCOUNTER — Other Ambulatory Visit (HOSPITAL_COMMUNITY): Payer: Self-pay

## 2022-02-20 ENCOUNTER — Other Ambulatory Visit (HOSPITAL_COMMUNITY): Payer: Self-pay

## 2022-02-25 ENCOUNTER — Encounter: Payer: Self-pay | Admitting: Obstetrics and Gynecology

## 2022-02-25 IMAGING — MR MR BREAST BILAT WO/W CM
8 of 12 series · 33 of 48 positions shown · IV contrast (7ml gadavist)
Comparison: None.

CLINICAL DATA: High risk screening. Patient has a family history of
breast 865. No current problems.

LABS:  None.
EXAM:
BILATERAL BREAST MRI WITH AND WITHOUT CONTRAST
TECHNIQUE: Multiplanar, multisequence MR images of both breasts were obtained
prior to and following the intravenous administration of 7 ml of
Gadavist

[Series 2: t2_tirm_tra ipat (a-p) · axial · 3.0mm · 0.70mm/px · 1 of 59 slices shown]
[im 1/59]
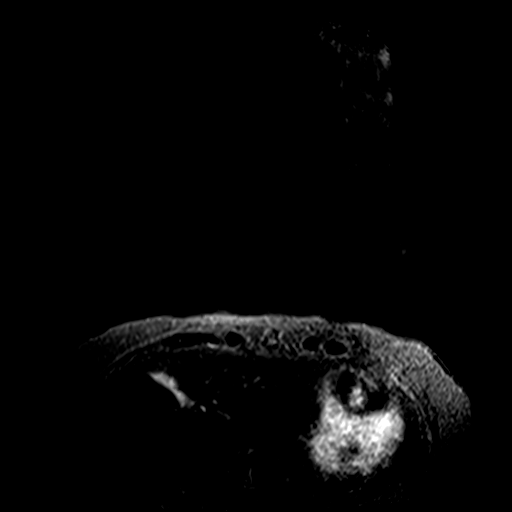

[Series 3: fl3d pre-cm no · axial · non-contrast · 1.2mm · 0.94mm/px · z∈[-97,+94]mm · 5 of 160 slices shown]
[im 1/160]
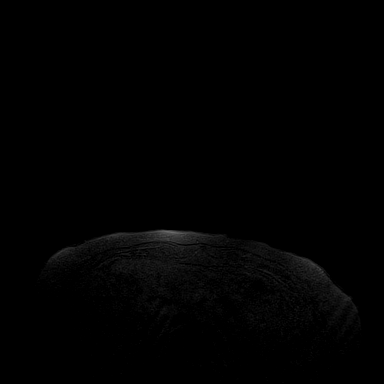
[im 40/160]
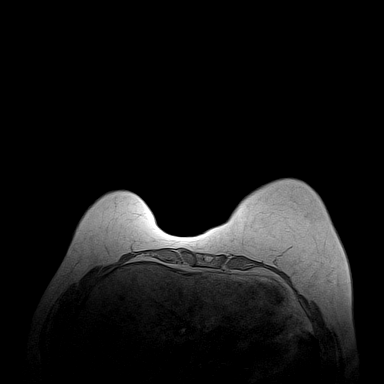
[im 80/160]
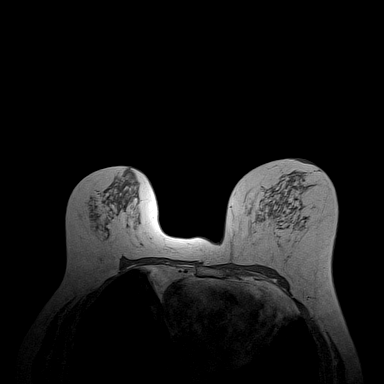
[im 120/160]
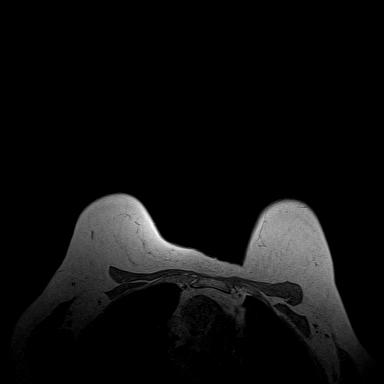
[im 160/160]
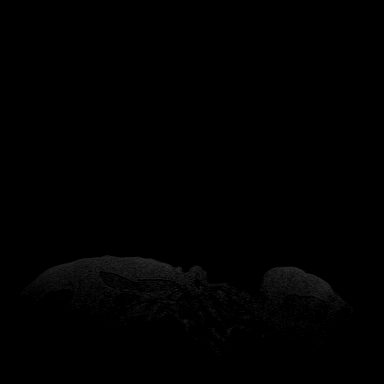

[Series 4: fl3d pre-cm · axial · non-contrast · 1.2mm · 0.94mm/px · z∈[-97,+94]mm · 5 of 160 slices shown]
[im 1/160]
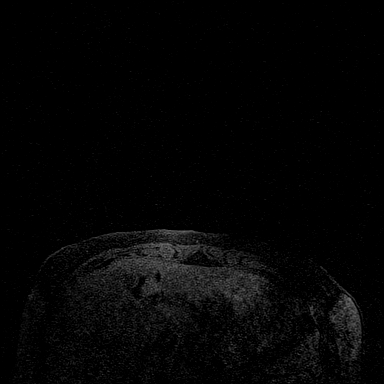
[im 40/160]
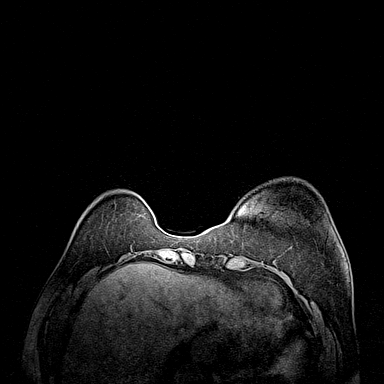
[im 80/160]
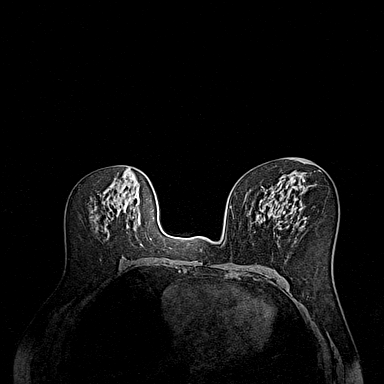
[im 120/160]
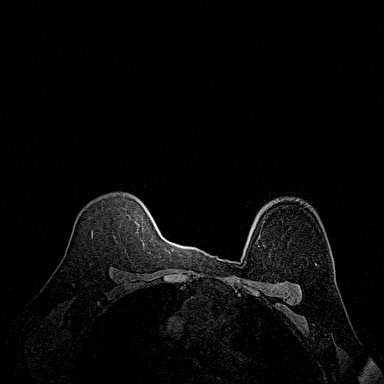
[im 160/160]
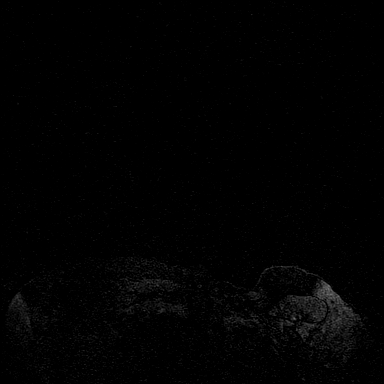

[Series 5: fl3d post-cm 20 · axial · 1.2mm · 0.94mm/px · z∈[-97,+94]mm · 5 of 160 slices shown (1 of 3)]
[im 1/160]
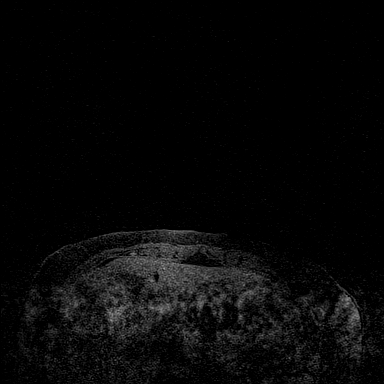
[im 40/160]
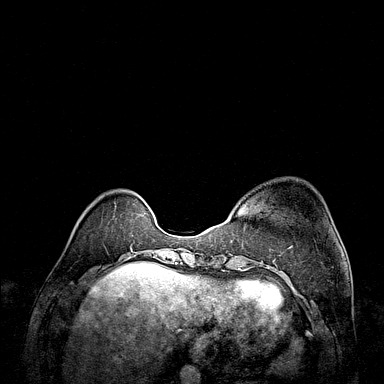
[im 80/160]
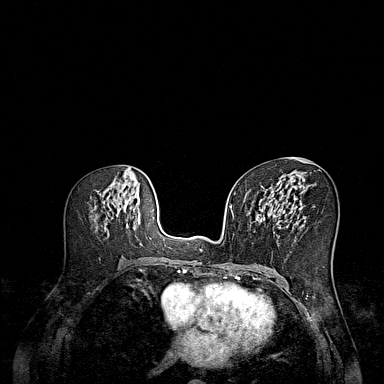
[im 120/160]
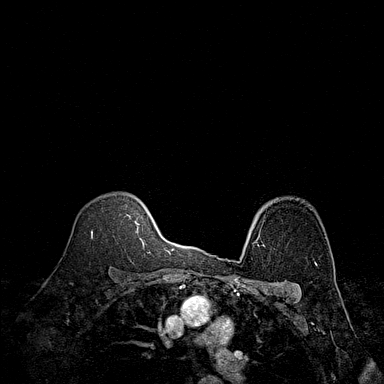
[im 160/160]
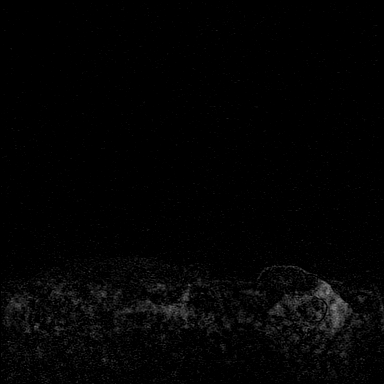

[Series 6: fl3d post-cm 20 · axial · 1.2mm · 0.94mm/px · z∈[-97,+94]mm · 5 of 160 slices shown (2 of 3)]
[im 1/160]
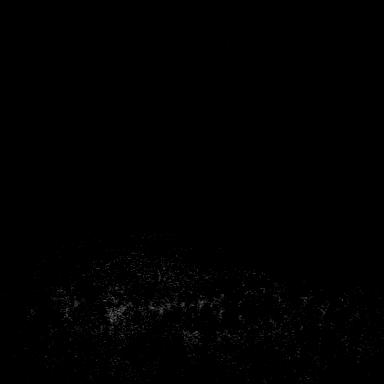
[im 40/160]
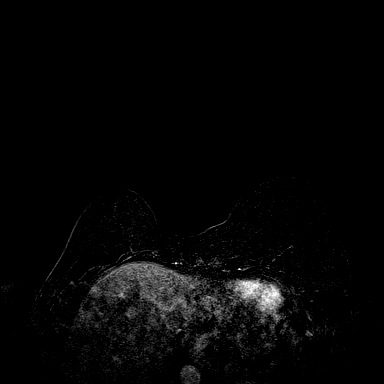
[im 80/160]
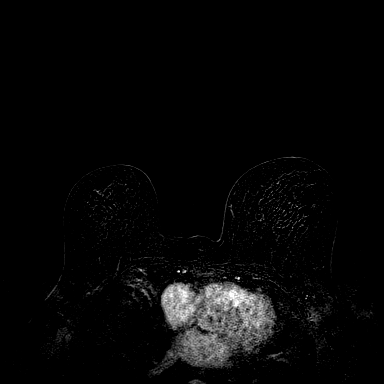
[im 120/160]
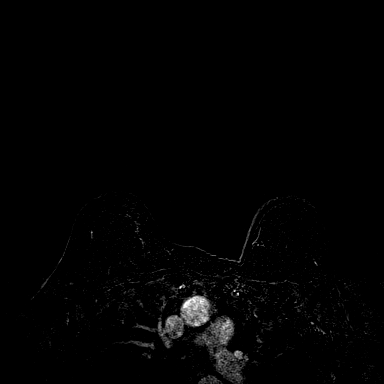
[im 160/160]
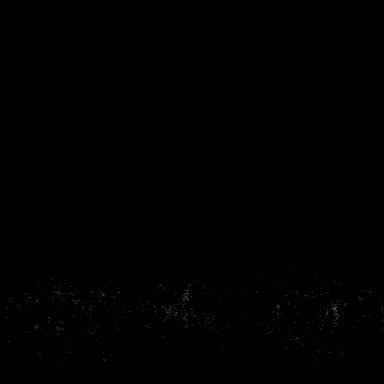

[Series 7: fl3d post-cm 20 · axial · 192.0mm · 0.94mm/px · 1 of 1 slices shown (3 of 3)]
[im 1/1]
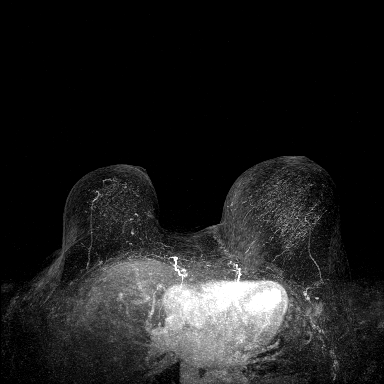

[Series 8: fl3d post-cm 3min · axial · 1.2mm · 0.94mm/px · z∈[-97,+94]mm · 6 of 160 slices shown]
[im 1/160]
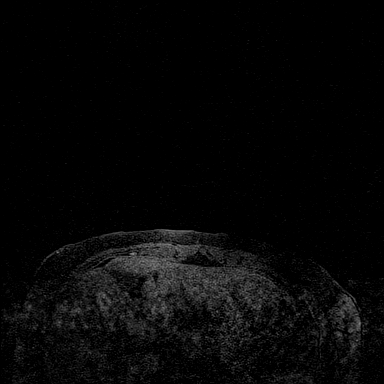
[im 32/160]
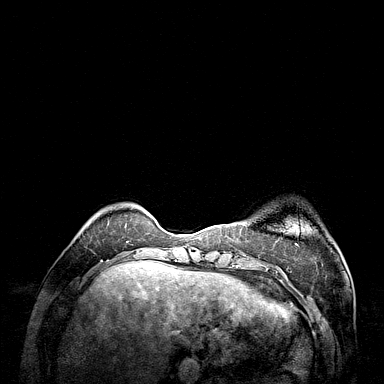
[im 64/160]
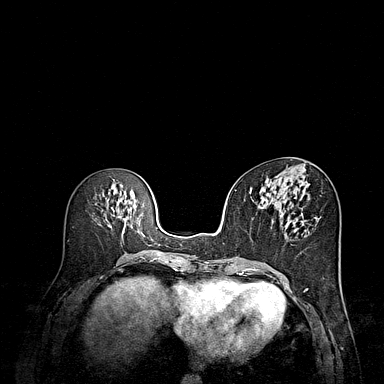
[im 96/160]
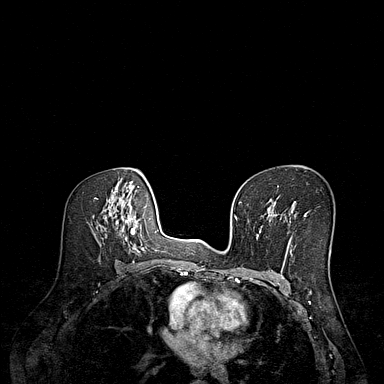
[im 128/160]
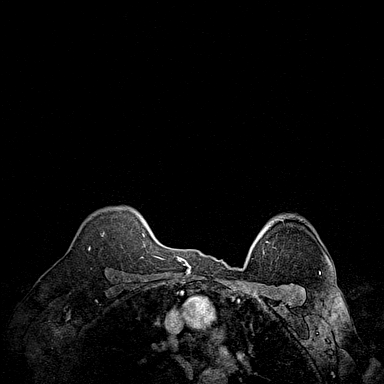
[im 160/160]
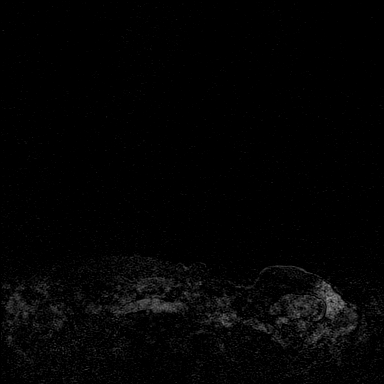

[Series 9: fl3d post-cm 3min_sub · axial · 1.2mm · 0.94mm/px · z∈[-97,+56]mm · 5 of 160 slices shown]
[im 1/160]
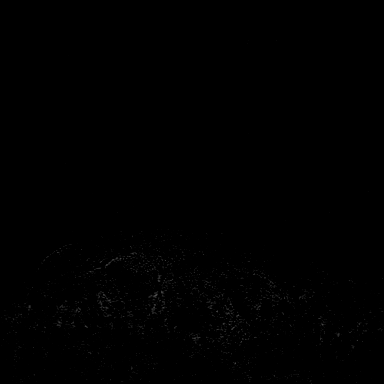
[im 32/160]
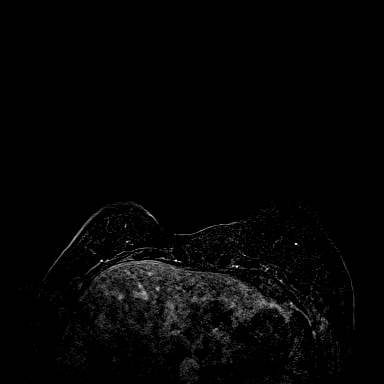
[im 64/160]
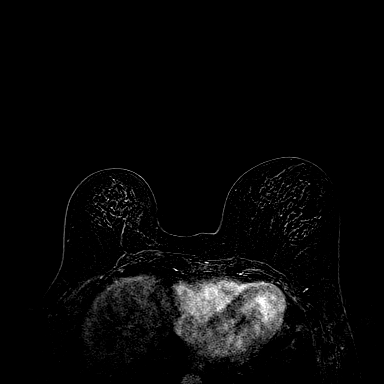
[im 96/160]
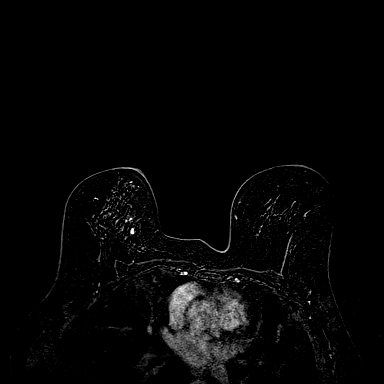
[im 128/160]
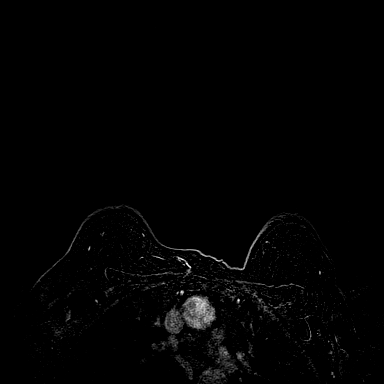

[33 of 48 positions shown; findings below may reference images not displayed]

Three-dimensional MR images were rendered by post-processing of the
original MR data on an independent workstation. The
three-dimensional MR images were interpreted, and findings are
reported in the following complete MRI report for this study. Three
dimensional images were evaluated at the independent interpreting
workstation using the DynaCAD thin client.
FINDINGS: Breast composition: c. Heterogeneous fibroglandular tissue.

Background parenchymal enhancement: Minimal

Right breast: No mass or abnormal enhancement.

Left breast: No mass or abnormal enhancement.

Lymph nodes: No abnormal appearing lymph nodes.

Ancillary findings:  None.
IMPRESSION: No MRI evidence of malignancy in either breast.

RECOMMENDATION:
1.  Continue routine annual screening mammography.

2. If patient is at elevated lifetime risk of breast cancer (greater
than 20%), recommend annual screening breast MRI in addition to
annual screening mammography.

BI-RADS CATEGORY  1: Negative.

## 2022-02-27 ENCOUNTER — Other Ambulatory Visit: Payer: Self-pay

## 2022-03-02 ENCOUNTER — Ambulatory Visit (INDEPENDENT_AMBULATORY_CARE_PROVIDER_SITE_OTHER): Payer: Medicare Other | Admitting: Urology

## 2022-03-02 ENCOUNTER — Ambulatory Visit (HOSPITAL_BASED_OUTPATIENT_CLINIC_OR_DEPARTMENT_OTHER)
Admission: RE | Admit: 2022-03-02 | Discharge: 2022-03-02 | Disposition: A | Discharge status: Home / Self Care | Payer: Medicare Other | Source: Ambulatory Visit | Attending: Urology | Admitting: Urology

## 2022-03-02 ENCOUNTER — Encounter: Payer: Self-pay | Admitting: Urology

## 2022-03-02 VITALS — BP 132/80 | HR 77 | Ht 65.0 in | Wt 159.0 lb

## 2022-03-02 DIAGNOSIS — N201 Calculus of ureter: Secondary | ICD-10-CM | POA: Insufficient documentation

## 2022-03-02 DIAGNOSIS — R3129 Other microscopic hematuria: Secondary | ICD-10-CM

## 2022-03-02 DIAGNOSIS — N393 Stress incontinence (female) (male): Secondary | ICD-10-CM

## 2022-03-02 DIAGNOSIS — R310 Gross hematuria: Secondary | ICD-10-CM

## 2022-03-02 DIAGNOSIS — N200 Calculus of kidney: Secondary | ICD-10-CM

## 2022-03-02 LAB — URINALYSIS
Bilirubin, UA: NEGATIVE
Blood, UA: NEGATIVE
Glucose, UA: NEGATIVE mg/dL
Ketones, POC UA: NEGATIVE mg/dL
Leukocytes, UA: NEGATIVE
Nitrite, UA: NEGATIVE
Protein Ur, POC: NEGATIVE mg/dL
Spec Grav, UA: 1.025 (ref 1.010–1.025)
Urobilinogen, UA: 0.2 E.U./dL
pH, UA: 6 (ref 5.0–8.0)

## 2022-03-02 NOTE — Progress Notes (Signed)
Assessment: 1. Ureteral calculus, right   2. Microscopic hematuria   3. Gross hematuria   4. Nephrolithiasis   5. Stress incontinence in female     Plan: I personally reviewed the KUB study from today.  Bilateral renal calcifications seen.  The previously noted calcification in the right proximal ureter is no longer visualized. Stone prevention discussed and information provided. Information on pelvic floor exercises provided for management of her stress incontinence. Return to office in 4 weeks with previsit renal ultrasound.  Chief Complaint:  Chief Complaint  Patient presents with   ureteral calculus    History of Present Illness:  Casey Conley is a 67 y.o. female who is seen for further evaluation of right ureteral calculus, gross and microscopic hematuria.  She noted onset of dark-colored urine on 12/31/2021.  This began after strenuous yard work.  No dysuria or flank pain.  No other urinary symptoms. Urinalysis was from 01/01/2022 showed 0-2 WBCs, TNTC RBCs. She reported that her urine has visibly cleared since that time.  No history of UTIs or kidney stones. No recent imaging. No history of tobacco use. Urinalysis from 01/06/2022 showed TNTC RBCs. CT hematuria protocol from 01/20/2022 showed approximately 10 stones in the right kidney, mild right-sided hydronephrosis secondary to a 4 x 5 x 7 mm stone at the right UPJ, numerous stones in the left kidney, no left hydronephrosis, 4.9 cm simple cyst upper pole right kidney. At her visit on 02/09/22, she reported 2 episodes of gross hematuria again associated with more strenuous activity.  She was not having any flank pain.  No dysuria. Cystoscopy from 02/09/22 showed no urethral or bladder abnormalities. Urine cytology was negative for malignancy.  She underwent right ESL on 02/16/22 for the proximal right ureteral calculus. She has done well since the procedure.  She has passed small fragments and gravel.  No flank pain.  She has  not had any further episodes of gross hematuria. She does have occasional stress incontinence associated with coughing and sneezing.  She does not use pads on a regular basis.   Portions of the above documentation were copied from a prior visit for review purposes only.   Past Medical History:  Past Medical History:  Diagnosis Date   Abnormal Pap smear of cervix    12-29-17 neg HPV HR+ 16,18/45 neg   ANEMIA-IRON DEFICIENCY 10/29/2008   Dysuria 03/29/2009   Family history of breast cancer 03/11/2020   Family history of colon cancer 03/11/2020   Fibroid, uterine    History of fibrocystic disease of breast    OSTEOPOROSIS 10/29/2008   Osteoporosis    on Bisphosphonate for aprox 2007   Proteinuria    24 hour-normal    Past Surgical History:  Past Surgical History:  Procedure Laterality Date   BREAST LUMPECTOMY  approx 1990   benign-left   EXTRACORPOREAL SHOCK WAVE LITHOTRIPSY Right 02/16/2022   Procedure: EXTRACORPOREAL SHOCK WAVE LITHOTRIPSY (ESWL);  Surgeon: Primus Bravo., MD;  Location: [facility];  Service: Urology;  Laterality: Right;   TONGUE SURGERY     had growth removed   UTERINE FIBROID EMBOLIZATION     s/p    Allergies:  No Known Allergies  Family History:  Family History  Problem Relation Age of Onset   Arthritis Mother    Dementia Mother    Transient ischemic attack Mother    Osteoporosis Mother    Breast cancer Mother        dx 26s, dx 63s  Diabetes Father    Hyperlipidemia Father    Heart disease Father        CAD/CABG   Colon cancer Father 37   Lung cancer Father 43   Diabetes Paternal Grandfather    Heart disease Paternal Grandfather    Cancer Maternal Grandmother 84       unknown type; ? kidney cancer   Uterine cancer Paternal Grandmother        or other "female cancer", dx 73s-70s   Cancer Maternal Uncle        unknown type; ? colon cancer; dx 90s   Cancer Paternal Uncle        sacral chordoma, d. 50   Colon polyps  Maternal Uncle        unknown number    Social History:  Social History   Tobacco Use   Smoking status: Never   Smokeless tobacco: Never  Vaping Use   Vaping Use: Never used  Substance Use Topics   Alcohol use: No    Alcohol/week: 0.0 standard drinks of alcohol   Drug use: No    ROS: Constitutional:  Negative for fever, chills, weight loss CV: Negative for chest pain, previous MI, hypertension Respiratory:  Negative for shortness of breath, wheezing, sleep apnea, frequent cough GI:  Negative for nausea, vomiting, bloody stool, GERD  Physical exam: BP 132/80   Pulse 77   Ht '5\' 5"'$  (1.651 m)   Wt 159 lb (72.1 kg)   LMP 09/02/2004   BMI 26.46 kg/m  GENERAL APPEARANCE:  Well appearing, well developed, well nourished, NAD HEENT:  Atraumatic, normocephalic, oropharynx clear NECK:  Supple without lymphadenopathy or thyromegaly ABDOMEN:  Soft, non-tender, no masses EXTREMITIES:  Moves all extremities well, without clubbing, cyanosis, or edema NEUROLOGIC:  Alert and oriented x 3, normal gait, CN II-XII grossly intact MENTAL STATUS:  appropriate BACK:  Non-tender to palpation, No CVAT SKIN:  Warm, dry, and intact  Results: U/A dipstick: Negative

## 2022-03-03 ENCOUNTER — Other Ambulatory Visit (HOSPITAL_COMMUNITY): Payer: Self-pay

## 2022-03-10 ENCOUNTER — Other Ambulatory Visit: Payer: Self-pay

## 2022-03-16 ENCOUNTER — Other Ambulatory Visit: Payer: Self-pay

## 2022-03-17 ENCOUNTER — Other Ambulatory Visit (HOSPITAL_COMMUNITY): Payer: Self-pay

## 2022-03-18 ENCOUNTER — Other Ambulatory Visit (HOSPITAL_COMMUNITY): Payer: Self-pay

## 2022-03-18 DIAGNOSIS — S82899A Other fracture of unspecified lower leg, initial encounter for closed fracture: Secondary | ICD-10-CM | POA: Insufficient documentation

## 2022-03-30 ENCOUNTER — Ambulatory Visit: Payer: Medicare Other | Admitting: Urology

## 2022-04-01 ENCOUNTER — Ambulatory Visit (HOSPITAL_COMMUNITY)
Admission: RE | Admit: 2022-04-01 | Discharge: 2022-04-01 | Disposition: A | Discharge status: Home / Self Care | Payer: Medicare Other | Source: Ambulatory Visit | Attending: Urology | Admitting: Urology

## 2022-04-01 DIAGNOSIS — N201 Calculus of ureter: Secondary | ICD-10-CM

## 2022-04-03 ENCOUNTER — Encounter: Payer: Self-pay | Admitting: Urology

## 2022-04-03 ENCOUNTER — Ambulatory Visit (INDEPENDENT_AMBULATORY_CARE_PROVIDER_SITE_OTHER): Payer: Medicare Other | Admitting: Urology

## 2022-04-03 VITALS — BP 114/75 | HR 80 | Ht 65.0 in | Wt 160.0 lb

## 2022-04-03 DIAGNOSIS — R3129 Other microscopic hematuria: Secondary | ICD-10-CM

## 2022-04-03 DIAGNOSIS — Z87898 Personal history of other specified conditions: Secondary | ICD-10-CM

## 2022-04-03 DIAGNOSIS — N200 Calculus of kidney: Secondary | ICD-10-CM

## 2022-04-03 DIAGNOSIS — R310 Gross hematuria: Secondary | ICD-10-CM

## 2022-04-03 LAB — URINALYSIS
Bilirubin, UA: NEGATIVE
Glucose, UA: NEGATIVE mg/dL
Ketones, POC UA: NEGATIVE mg/dL
Leukocytes, UA: NEGATIVE
Nitrite, UA: NEGATIVE
Protein Ur, POC: NEGATIVE mg/dL
Spec Grav, UA: 1.02 (ref 1.010–1.025)
Urobilinogen, UA: 0.2 E.U./dL
pH, UA: 7 (ref 5.0–8.0)

## 2022-04-03 NOTE — Progress Notes (Signed)
Assessment: 1. Nephrolithiasis   2. Microscopic hematuria; evaluation 1/24   3. History of gross hematuria     Plan: I personally reviewed the renal ultrasound study from 04/01/2022 with results as noted below. I again reviewed the CT study from 12/23.  She did not have evidence of a large stone in either kidney.  She has numerous small renal calculi. Continue stone prevention with dietary restrictions.  Information provided. Return to office in 6 months with KUB  Chief Complaint:  Chief Complaint  Patient presents with   Nephrolithiasis    History of Present Illness:  Casey Conley is a 67 y.o. female who is seen for further evaluation of bilateral nephrolithiasis, history of right ureteral calculus, gross and microscopic hematuria.  She noted onset of dark-colored urine on 12/31/2021.  This began after strenuous yard work.  No dysuria or flank pain.  No other urinary symptoms. Urinalysis was from 01/01/2022 showed 0-2 WBCs, TNTC RBCs. She reported that her urine has visibly cleared since that time.  No history of UTIs or kidney stones. No recent imaging. No history of tobacco use. Urinalysis from 01/06/2022 showed TNTC RBCs. CT hematuria protocol from 01/20/2022 showed approximately 10 stones in the right kidney, mild right-sided hydronephrosis secondary to a 4 x 5 x 7 mm stone at the right UPJ, numerous stones in the left kidney, no left hydronephrosis, 4.9 cm simple cyst upper pole right kidney. At her visit on 02/09/22, she reported 2 episodes of gross hematuria again associated with more strenuous activity.  She was not having any flank pain.  No dysuria. Cystoscopy from 02/09/22 showed no urethral or bladder abnormalities. Urine cytology was negative for malignancy.  She underwent right ESL on 02/16/22 for the proximal right ureteral calculus. She has done well since the procedure, passing small fragments and gravel.  No flank pain.  No further episodes of gross hematuria. She does  have occasional stress incontinence associated with coughing and sneezing.  She does not use pads on a regular basis.  Renal ultrasound from 04/01/2022 showed bilateral renal stones, a right parapelvic cyst, no evidence of hydronephrosis.  She returns today for follow-up.  She is not having any flank pain.  She has not passed any additional stone fragments since her last visit.  No dysuria or gross hematuria.  Her stress incontinence has improved.  She has not had any recent incontinence episodes.  Portions of the above documentation were copied from a prior visit for review purposes only.   Past Medical History:  Past Medical History:  Diagnosis Date   Abnormal Pap smear of cervix    12-29-17 neg HPV HR+ 16,18/45 neg   ANEMIA-IRON DEFICIENCY 10/29/2008   Dysuria 03/29/2009   Family history of breast cancer 03/11/2020   Family history of colon cancer 03/11/2020   Fibroid, uterine    History of fibrocystic disease of breast    OSTEOPOROSIS 10/29/2008   Osteoporosis    on Bisphosphonate for aprox 2007   Proteinuria    24 hour-normal    Past Surgical History:  Past Surgical History:  Procedure Laterality Date   BREAST LUMPECTOMY  approx 1990   benign-left   EXTRACORPOREAL SHOCK WAVE LITHOTRIPSY Right 02/16/2022   Procedure: EXTRACORPOREAL SHOCK WAVE LITHOTRIPSY (ESWL);  Surgeon: Primus Bravo., MD;  Location: [facility];  Service: Urology;  Laterality: Right;   TONGUE SURGERY     had growth removed   UTERINE FIBROID EMBOLIZATION     s/p    Allergies:  No Known Allergies  Family History:  Family History  Problem Relation Age of Onset   Arthritis Mother    Dementia Mother    Transient ischemic attack Mother    Osteoporosis Mother    Breast cancer Mother        dx 80s, dx 53s   Diabetes Father    Hyperlipidemia Father    Heart disease Father        CAD/CABG   Colon cancer Father 68   Lung cancer Father 45   Diabetes Paternal Grandfather    Heart  disease Paternal Grandfather    Cancer Maternal Grandmother 84       unknown type; ? kidney cancer   Uterine cancer Paternal Grandmother        or other "female cancer", dx 56s-70s   Cancer Maternal Uncle        unknown type; ? colon cancer; dx 51s   Cancer Paternal Uncle        sacral chordoma, d. 42   Colon polyps Maternal Uncle        unknown number    Social History:  Social History   Tobacco Use   Smoking status: Never   Smokeless tobacco: Never  Vaping Use   Vaping Use: Never used  Substance Use Topics   Alcohol use: No    Alcohol/week: 0.0 standard drinks of alcohol   Drug use: No    ROS: Constitutional:  Negative for fever, chills, weight loss CV: Negative for chest pain, previous MI, hypertension Respiratory:  Negative for shortness of breath, wheezing, sleep apnea, frequent cough GI:  Negative for nausea, vomiting, bloody stool, GERD  Physical exam: BP 114/75   Pulse 80   Ht '5\' 5"'$  (1.651 m)   Wt 160 lb (72.6 kg)   LMP 09/02/2004   BMI 26.63 kg/m  GENERAL APPEARANCE:  Well appearing, well developed, well nourished, NAD HEENT:  Atraumatic, normocephalic, oropharynx clear NECK:  Supple without lymphadenopathy or thyromegaly ABDOMEN:  Soft, non-tender, no masses EXTREMITIES:  Moves all extremities well, without clubbing, cyanosis, or edema NEUROLOGIC:  Alert and oriented x 3, normal gait, CN II-XII grossly intact MENTAL STATUS:  appropriate BACK:  Non-tender to palpation, No CVAT SKIN:  Warm, dry, and intact  Results: U/A dipstick:  1+ blood

## 2022-04-20 ENCOUNTER — Ambulatory Visit (INDEPENDENT_AMBULATORY_CARE_PROVIDER_SITE_OTHER): Payer: Medicare Other | Admitting: Internal Medicine

## 2022-04-20 ENCOUNTER — Encounter: Payer: Self-pay | Admitting: Internal Medicine

## 2022-04-20 ENCOUNTER — Other Ambulatory Visit (HOSPITAL_COMMUNITY): Payer: Self-pay

## 2022-04-20 VITALS — BP 124/78 | HR 79 | Temp 97.9°F | Ht 65.0 in | Wt 162.0 lb

## 2022-04-20 DIAGNOSIS — E7800 Pure hypercholesterolemia, unspecified: Secondary | ICD-10-CM | POA: Diagnosis not present

## 2022-04-20 DIAGNOSIS — R739 Hyperglycemia, unspecified: Secondary | ICD-10-CM

## 2022-04-20 DIAGNOSIS — E669 Obesity, unspecified: Secondary | ICD-10-CM

## 2022-04-20 DIAGNOSIS — E559 Vitamin D deficiency, unspecified: Secondary | ICD-10-CM

## 2022-04-20 MED ORDER — WEGOVY 1 MG/0.5ML ~~LOC~~ SOAJ: 1.0000 mg | SUBCUTANEOUS | 11 refills | Status: DC

## 2022-04-20 MED ORDER — ASPIRIN 81 MG PO TBEC: 81.0000 mg | DELAYED_RELEASE_TABLET | Freq: Every day | ORAL | 12 refills | Status: AC

## 2022-04-20 MED ORDER — SEMAGLUTIDE-WEIGHT MANAGEMENT 1 MG/0.5ML ~~LOC~~ SOAJ
1.0000 mg | SUBCUTANEOUS | 11 refills | Status: DC
  Filled 2022-04-20: qty 2, 28d supply, fill #0
  Filled 2022-05-18: qty 2, 28d supply, fill #1
  Filled 2022-06-14: qty 2, 28d supply, fill #2
  Filled 2022-07-18: qty 2, 28d supply, fill #3
  Filled 2022-08-18: qty 2, 28d supply, fill #4
  Filled 2022-09-15: qty 2, 28d supply, fill #5
  Filled 2022-10-18: qty 2, 28d supply, fill #6
  Filled 2022-11-15: qty 2, 28d supply, fill #7
  Filled 2022-12-11: qty 2, 28d supply, fill #8
  Filled 2023-01-11: qty 2, 28d supply, fill #9
  Filled 2023-02-11: qty 2, 28d supply, fill #10
  Filled 2023-03-27: qty 2, 28d supply, fill #11

## 2022-04-20 MED ORDER — ROSUVASTATIN CALCIUM 20 MG PO TABS: 20.0000 mg | ORAL_TABLET | ORAL | 3 refills | Status: DC

## 2022-04-20 NOTE — Assessment & Plan Note (Signed)
Lab Results  Component Value Date   HGBA1C 6.2 01/01/2022   Stable, pt to continue current medical treatment  - diet , wt control

## 2022-04-20 NOTE — Assessment & Plan Note (Signed)
[place] for increased wegovy to 1 mg weekly, tolerating well.

## 2022-04-20 NOTE — Progress Notes (Signed)
Patient ID: Casey Conley, female   DOB: 09-26-1955, 67 y.o.   MRN: [id number]        Chief Complaint: follow up obesity management       HPI:  Casey Conley is a 67 y.o. female here with c/o unable to lose significant wt so far with 0.5 mg weekly wegovy though admits part of the problem has been diet control and unable to get the 0.5 mg in stock, but has had better luck at [facility] than [facility].  Pt denies chest pain, increased sob or doe, wheezing, orthopnea, PND, increased LE swelling, palpitations, dizziness or syncope.   Pt denies polydipsia, polyuria, or new focal neuro s/s.    Pt denies fever, wt loss, night sweats, loss of appetite, or other constitutional symptoms         Wt Readings from Last 3 Encounters:  04/20/22 162 lb (73.5 kg)  04/03/22 160 lb (72.6 kg)  03/02/22 159 lb (72.1 kg)   BP Readings from Last 3 Encounters:  04/20/22 124/78  04/03/22 114/75  03/02/22 132/80         Past Medical History:  Diagnosis Date   Abnormal Pap smear of cervix    12-29-17 neg HPV HR+ 16,18/45 neg   ANEMIA-IRON DEFICIENCY 10/29/2008   Dysuria 03/29/2009   Family history of breast cancer 03/11/2020   Family history of colon cancer 03/11/2020   Fibroid, uterine    History of fibrocystic disease of breast    OSTEOPOROSIS 10/29/2008   Osteoporosis    on Bisphosphonate for aprox 2007   Proteinuria    24 hour-normal   Past Surgical History:  Procedure Laterality Date   BREAST LUMPECTOMY  approx 1990   benign-left   EXTRACORPOREAL SHOCK WAVE LITHOTRIPSY Right 02/16/2022   Procedure: EXTRACORPOREAL SHOCK WAVE LITHOTRIPSY (ESWL);  Surgeon: Primus Bravo., MD;  Location: [facility];  Service: Urology;  Laterality: Right;   TONGUE SURGERY     had growth removed   UTERINE FIBROID EMBOLIZATION     s/p    reports that she has never smoked. She has never used smokeless tobacco. She reports that she does not drink alcohol and does not use drugs. family history includes Arthritis  in her mother; Breast cancer in her mother; Cancer in her maternal uncle and paternal uncle; Cancer (age of onset: 61) in her maternal grandmother; Colon cancer (age of onset: 50) in her father; Colon polyps in her maternal uncle; Dementia in her mother; Diabetes in her father and paternal grandfather; Heart disease in her father and paternal grandfather; Hyperlipidemia in her father; Lung cancer (age of onset: 7) in her father; Osteoporosis in her mother; Transient ischemic attack in her mother; Uterine cancer in her paternal grandmother. No Known Allergies Current Outpatient Medications on File Prior to Visit  Medication Sig Dispense Refill   CALCIUM PO Take 1,000 mg by mouth.     denosumab (PROLIA) 60 MG/ML SOSY injection Inject 60 mg into the skin every 6 (six) months.     Doxylamine Succinate, Sleep, (SLEEP AID PO) Take by mouth at bedtime.     naproxen sodium (ALEVE) 220 MG tablet Take 220 mg by mouth daily as needed.     Semaglutide-Weight Management (WEGOVY) 0.25 MG/0.5ML SOAJ Inject 0.25 mg into the skin once a week. 2 mL 11   Semaglutide-Weight Management (WEGOVY) 0.5 MG/0.5ML SOAJ Inject 0.5 mg into the skin once a week. 2 mL 11   Urea 45 % CREA Use as  directed twice per day as needed 255 g 2   VITAMIN D-VITAMIN K PO Take by mouth. Vitamin d 5000     No current facility-administered medications on file prior to visit.        ROS:  All others reviewed and negative.  Objective        PE:  BP 124/78 (BP Location: Right Arm, Patient Position: Sitting, Cuff Size: Normal)   Pulse 79   Temp 97.9 F (36.6 C) (Oral)   Ht 5\' 5"  (1.651 m)   Wt 162 lb (73.5 kg)   LMP 09/02/2004   SpO2 99%   BMI 26.96 kg/m                 Constitutional: Pt appears in NAD               HENT: Head: NCAT.                Right Ear: External ear normal.                 Left Ear: External ear normal.                Eyes: . Pupils are equal, round, and reactive to light. Conjunctivae and EOM are normal                Nose: without d/c or deformity               Neck: Neck supple. Gross normal ROM               Cardiovascular: Normal rate and regular rhythm.                 Pulmonary/Chest: Effort normal and breath sounds without rales or wheezing.                Abd:  Soft, NT, ND, + BS, no organomegaly               Neurological: Pt is alert. At baseline orientation, motor grossly intact               Skin: Skin is warm. No rashes, no other new lesions, LE edema -none               Psychiatric: Pt behavior is normal without agitation   Micro: none  Cardiac tracings I have personally interpreted today:  none  Pertinent Radiological findings (summarize): none   Lab Results  Component Value Date   WBC 6.2 01/01/2022   HGB 14.4 01/01/2022   HCT 42.6 01/01/2022   PLT 213.0 01/01/2022   GLUCOSE 99 01/01/2022   CHOL 146 01/01/2022   TRIG 56.0 01/01/2022   HDL 89.30 01/01/2022   LDLDIRECT 103.6 06/01/2012   LDLCALC 46 01/01/2022   ALT 21 01/01/2022   AST 24 01/01/2022   NA 141 01/01/2022   K 3.9 01/01/2022   CL 104 01/01/2022   CREATININE 0.91 01/01/2022   BUN 22 01/01/2022   CO2 29 01/01/2022   TSH 5.02 01/01/2022   HGBA1C 6.2 01/01/2022   Assessment/Plan:  Casey Conley is a 67 y.o. White or Caucasian [1] female with  has a past medical history of Abnormal Pap smear of cervix, ANEMIA-IRON DEFICIENCY (10/29/2008), Dysuria (03/29/2009), Family history of breast cancer (03/11/2020), Family history of colon cancer (03/11/2020), Fibroid, uterine, History of fibrocystic disease of breast, OSTEOPOROSIS (10/29/2008), Osteoporosis, and Proteinuria.  Obesity (BMI 30-39.9) Ok for increased wegovy to 1 mg weekly,  tolerating well.    Vitamin D deficiency Last vitamin D Lab Results  Component Value Date   VD25OH 89.25 01/01/2022   Stable, cont oral replacement   Hyperglycemia Lab Results  Component Value Date   HGBA1C 6.2 01/01/2022   Stable, pt to continue current medical treatment  -  diet , wt control   HLD (hyperlipidemia) Lab Results  Component Value Date   LDLCALC 46 01/01/2022   Stable, pt to continue current statin crestor 20 mg qd  Followup: Return in about 6 months (around 10/21/2022).  Cathlean Cower, MD 04/20/2022 2:24 PM [facility]

## 2022-04-20 NOTE — Assessment & Plan Note (Signed)
Lab Results  Component Value Date   LDLCALC 46 01/01/2022   Stable, pt to continue current statin crestor 20 mg qd

## 2022-04-20 NOTE — Assessment & Plan Note (Signed)
Last vitamin D Lab Results  Component Value Date   VD25OH 89.25 01/01/2022   Stable, cont oral replacement

## 2022-04-20 NOTE — Patient Instructions (Signed)
Ok to increase the wegovy to the 1 mg weekly dosing, and call in 1-2 mo if you would want to try the next higher dose for wt loss  Please continue all other medications as before, and refills have been done if requested - aspirin and statin  Please have the pharmacy call with any other refills you may need.  Please continue your efforts at being more active, low cholesterol diet, and weight control.  Please keep your appointments with your specialists as you may have planned  We can hold on lab testing today  Please make an Appointment to return in 6 months, or sooner if needed

## 2022-04-21 ENCOUNTER — Other Ambulatory Visit (HOSPITAL_COMMUNITY): Payer: Self-pay

## 2022-05-18 ENCOUNTER — Ambulatory Visit: Payer: Medicare Other | Admitting: Obstetrics and Gynecology

## 2022-05-19 ENCOUNTER — Ambulatory Visit (INDEPENDENT_AMBULATORY_CARE_PROVIDER_SITE_OTHER): Payer: Medicare Other | Admitting: Obstetrics and Gynecology

## 2022-05-19 ENCOUNTER — Encounter: Payer: Self-pay | Admitting: Obstetrics and Gynecology

## 2022-05-19 ENCOUNTER — Other Ambulatory Visit (HOSPITAL_COMMUNITY): Payer: Self-pay

## 2022-05-19 VITALS — BP 122/70 | HR 68

## 2022-05-19 DIAGNOSIS — Z9189 Other specified personal risk factors, not elsewhere classified: Secondary | ICD-10-CM

## 2022-05-19 NOTE — Progress Notes (Signed)
67 y.o. G0P0000 Married White or Caucasian Not Hispanic or Latino female here for Breast exam. TC risk of breast cancer of 22%.  No vaginal bleeding.     Patient's last menstrual period was 09/02/2004.          Sexually active: No.  The current method of family planning is post menopausal status.    Exercising: Yes.    Exercise is limited by orthopedic condition(s): broke her ankle.. Smoker:  no  Health Maintenance: Pap:    05/09/20 WNL, neg hpv; 01/02/19 WNL HR HPV NEG, 12-29-17 neg HPV HR+ 16,18/45 neg,  07-04-15 neg History of abnormal Pap:  yes MMG:  02/23/22 Bi-rads 1 neg  Breast MR: 08/07/20 negative. BMD:  07/15/20 Osteopenia, on prolia scheduled. Followed by Orthopedics.  Colonoscopy: 10/23 at [facility], 1 polyp,  f/u 67yrs, TDaP:  2019 Gardasil: n/a   reports that she has never smoked. She has never used smokeless tobacco. She reports that she does not drink alcohol and does not use drugs. Retiring in 5/24.   Past Medical History:  Diagnosis Date   Abnormal Pap smear of cervix    12-29-17 neg HPV HR+ 16,18/45 neg   ANEMIA-IRON DEFICIENCY 10/29/2008   Dysuria 03/29/2009   Family history of breast cancer 03/11/2020   Family history of colon cancer 03/11/2020   Fibroid, uterine    History of fibrocystic disease of breast    OSTEOPOROSIS 10/29/2008   Osteoporosis    on Bisphosphonate for aprox 2007   Proteinuria    24 hour-normal    Past Surgical History:  Procedure Laterality Date   BREAST LUMPECTOMY  approx 1990   benign-left   EXTRACORPOREAL SHOCK WAVE LITHOTRIPSY Right 02/16/2022   Procedure: EXTRACORPOREAL SHOCK WAVE LITHOTRIPSY (ESWL);  Surgeon: Milderd Meager., MD;  Location: [facility];  Service: Urology;  Laterality: Right;   TONGUE SURGERY     had growth removed   UTERINE FIBROID EMBOLIZATION     s/p    Current Outpatient Medications  Medication Sig Dispense Refill   aspirin EC 81 MG tablet Take 1 tablet (81 mg total) by mouth daily. Swallow  whole. 100 tablet 12   CALCIUM PO Take 1,000 mg by mouth.     denosumab (PROLIA) 60 MG/ML SOSY injection Inject 60 mg into the skin every 6 (six) months.     Doxylamine Succinate, Sleep, (SLEEP AID PO) Take by mouth at bedtime.     naproxen sodium (ALEVE) 220 MG tablet Take 220 mg by mouth daily as needed.     rosuvastatin (CRESTOR) 20 MG tablet Take 1 tablet (20 mg total) by mouth every other day. 45 tablet 3   Semaglutide-Weight Management (WEGOVY) 0.25 MG/0.5ML SOAJ Inject 0.25 mg into the skin once a week. 2 mL 11   Semaglutide-Weight Management (WEGOVY) 0.5 MG/0.5ML SOAJ Inject 0.5 mg into the skin once a week. 2 mL 11   Semaglutide-Weight Management (WEGOVY) 1 MG/0.5ML SOAJ Inject 1 mg into the skin once a week. 2 mL 11   Semaglutide-Weight Management 1 MG/0.5ML SOAJ Inject 1 mg into the skin once a week. 2 mL 11   Urea 45 % CREA Use as directed twice per day as needed 255 g 2   VITAMIN D-VITAMIN K PO Take by mouth. Vitamin d 5000     No current facility-administered medications for this visit.    Family History  Problem Relation Age of Onset   Arthritis Mother    Dementia Mother    Transient ischemic  attack Mother    Osteoporosis Mother    Breast cancer Mother        dx 69s, dx 36s   Diabetes Father    Hyperlipidemia Father    Heart disease Father        CAD/CABG   Colon cancer Father 17   Lung cancer Father 110   Diabetes Paternal Grandfather    Heart disease Paternal Grandfather    Cancer Maternal Grandmother 11       unknown type; ? kidney cancer   Uterine cancer Paternal Grandmother        or other "female cancer", dx 57s-70s   Cancer Maternal Uncle        unknown type; ? colon cancer; dx 90s   Cancer Paternal Uncle        sacral chordoma, d. 2   Colon polyps Maternal Uncle        unknown number    Review of Systems  All other systems reviewed and are negative.   Exam:   LMP 09/02/2004   Weight change: @ Height:      Ht Readings from Last 3  Encounters:  04/20/22  (1.651 m)  04/03/22  (1.651 m)  03/02/22  (1.651 m)    General appearance: alert, cooperative and appears stated age Breasts: normal appearance, no masses or tenderness No axillary or supraclavicular adenopathy  1. At increased risk of breast cancer Mammogram UTD Will order breast MR for 7/24 She is doing breast self exams

## 2022-05-19 NOTE — Patient Instructions (Addendum)
Breast Self-Awareness Breast self-awareness means being familiar with how your breasts look and feel. It involves checking your breasts regularly and reporting any changes to your health care provider. Practicing breast self-awareness is important. A change in your breasts can be a sign of a serious medical problem. Being familiar with how your breasts look and feel allows you to find any problems early, when treatment is more likely to be successful. All women should practice breast self-awareness, including women who have had breast implants. How to do a breast self-exam One way to learn what is normal for your breasts and whether your breasts are changing is to do a breast self-exam. To do a breast self-exam: Look for Changes  Remove all the clothing above your waist. Stand in front of a mirror in a room with good lighting. Put your hands on your hips. Push your hands firmly downward. Compare your breasts in the mirror. Look for differences between them (asymmetry), such as: Differences in shape. Differences in size. Puckers, dips, and bumps in one breast and not the other. Look at each breast for changes in your skin, such as: Redness. Scaly areas. Look for changes in your nipples, such as: Discharge. Bleeding. Dimpling. Redness. A change in position. Feel for Changes Carefully feel your breasts for lumps and changes. It is best to do this while lying on your back on the floor and again while sitting or standing in the shower or tub with soapy water on your skin. Feel each breast in the following way: Place the arm on the side of the breast you are examining above your head. Feel your breast with the other hand. Start in the nipple area and make  inch (2 cm) overlapping circles to feel your breast. Use the pads of your three middle fingers to do this. Apply light pressure, then medium pressure, then firm pressure. The light pressure will allow you to feel the tissue closest to the  skin. The medium pressure will allow you to feel the tissue that is a little deeper. The firm pressure will allow you to feel the tissue close to the ribs. Continue the overlapping circles, moving downward over the breast until you feel your ribs below your breast. Move one finger-width toward the center of the body. Continue to use the  inch (2 cm) overlapping circles to feel your breast as you move slowly up toward your collarbone. Continue the up and down exam using all three pressures until you reach your armpit.  Write Down What You Find  Write down what is normal for each breast and any changes that you find. Keep a written record with breast changes or normal findings for each breast. By writing this information down, you do not need to depend only on memory for size, tenderness, or location. Write down where you are in your menstrual cycle, if you are still menstruating. If you are having trouble noticing differences in your breasts, do not get discouraged. With time you will become more familiar with the variations in your breasts and more comfortable with the exam. How often should I examine my breasts? Examine your breasts every month. If you are breastfeeding, the best time to examine your breasts is after a feeding or after using a breast pump. If you menstruate, the best time to examine your breasts is 5-7 days after your period is over. During your period, your breasts are lumpier, and it may be more difficult to notice changes. When should I see my  health care provider? See your health care provider if you notice: A change in shape or size of your breasts or nipples. A change in the skin of your breast or nipples, such as a reddened or scaly area. Unusual discharge from your nipples. A lump or thick area that was not there before. Pain in your breasts. Anything that concerns you.

## 2022-05-21 ENCOUNTER — Telehealth: Payer: Self-pay | Admitting: *Deleted

## 2022-05-21 DIAGNOSIS — Z9189 Other specified personal risk factors, not elsewhere classified: Secondary | ICD-10-CM

## 2022-05-21 NOTE — Telephone Encounter (Signed)
-----   Message from Romualdo Bolk, MD sent at 05/19/2022  3:18 PM EDT ----- She needs a breast MRI in 7/24, elevated risk of breast cancer. Thanks, Noreene Larsson

## 2022-05-26 ENCOUNTER — Encounter: Payer: Self-pay | Admitting: Obstetrics and Gynecology

## 2022-05-26 NOTE — Telephone Encounter (Signed)
Spoke with patient.  Advised order placed for Bilater Breast MRI at [facility], number provided, patient will call directly to schedule. Advised once scheduled our business office will review benefits and notify if not approved. Questions answered. Patient verbalizes understanding and is agreeable.   Routing to provider for final review. Patient is agreeable to disposition. Will close encounter.

## 2022-06-15 ENCOUNTER — Other Ambulatory Visit (HOSPITAL_COMMUNITY): Payer: Self-pay

## 2022-06-16 ENCOUNTER — Other Ambulatory Visit (HOSPITAL_COMMUNITY): Payer: Self-pay

## 2022-06-23 ENCOUNTER — Encounter: Payer: Self-pay | Admitting: Internal Medicine

## 2022-06-23 ENCOUNTER — Other Ambulatory Visit: Payer: Self-pay | Admitting: Internal Medicine

## 2022-06-23 MED ORDER — PREDNISONE 10 MG PO TABS: ORAL_TABLET | ORAL | 0 refills | Status: DC

## 2022-07-18 ENCOUNTER — Other Ambulatory Visit (HOSPITAL_COMMUNITY): Payer: Self-pay

## 2022-08-18 ENCOUNTER — Other Ambulatory Visit: Payer: Self-pay

## 2022-08-27 ENCOUNTER — Ambulatory Visit
Admission: RE | Admit: 2022-08-27 | Discharge: 2022-08-27 | Disposition: A | Discharge status: Home / Self Care | Payer: Medicare Other | Source: Ambulatory Visit | Attending: Obstetrics and Gynecology | Admitting: Obstetrics and Gynecology

## 2022-08-27 DIAGNOSIS — Z9189 Other specified personal risk factors, not elsewhere classified: Secondary | ICD-10-CM

## 2022-08-27 MED ORDER — GADOPICLENOL 0.5 MMOL/ML IV SOLN
7.0000 mL | Freq: Once | INTRAVENOUS | Status: AC | PRN
  Administered 2022-08-27: 7 mL via INTRAVENOUS

## 2022-09-16 ENCOUNTER — Other Ambulatory Visit: Payer: Self-pay

## 2022-09-22 ENCOUNTER — Other Ambulatory Visit: Payer: Self-pay | Admitting: Urology

## 2022-10-06 ENCOUNTER — Ambulatory Visit: Payer: Medicare Other | Admitting: Urology

## 2022-10-14 ENCOUNTER — Other Ambulatory Visit: Payer: Self-pay

## 2022-10-14 DIAGNOSIS — N200 Calculus of kidney: Secondary | ICD-10-CM

## 2022-10-20 ENCOUNTER — Other Ambulatory Visit (HOSPITAL_COMMUNITY): Payer: Self-pay

## 2022-10-27 ENCOUNTER — Ambulatory Visit (INDEPENDENT_AMBULATORY_CARE_PROVIDER_SITE_OTHER): Payer: Medicare Other | Admitting: Urology

## 2022-10-27 ENCOUNTER — Encounter: Payer: Self-pay | Admitting: Urology

## 2022-10-27 ENCOUNTER — Ambulatory Visit (HOSPITAL_BASED_OUTPATIENT_CLINIC_OR_DEPARTMENT_OTHER)
Admission: RE | Admit: 2022-10-27 | Discharge: 2022-10-27 | Disposition: A | Discharge status: Home / Self Care | Payer: Medicare Other | Source: Ambulatory Visit | Attending: Urology | Admitting: Urology

## 2022-10-27 VITALS — BP 137/84 | HR 86 | Ht 65.5 in | Wt 150.0 lb

## 2022-10-27 DIAGNOSIS — R3129 Other microscopic hematuria: Secondary | ICD-10-CM

## 2022-10-27 DIAGNOSIS — N200 Calculus of kidney: Secondary | ICD-10-CM | POA: Insufficient documentation

## 2022-10-27 LAB — URINALYSIS, ROUTINE W REFLEX MICROSCOPIC
Bilirubin, UA: NEGATIVE
Glucose, UA: NEGATIVE
Ketones, UA: NEGATIVE
Nitrite, UA: NEGATIVE
Protein,UA: NEGATIVE
Specific Gravity, UA: 1.02 (ref 1.005–1.030)
Urobilinogen, Ur: 0.2 mg/dL (ref 0.2–1.0)
pH, UA: 7 (ref 5.0–7.5)

## 2022-10-27 LAB — MICROSCOPIC EXAMINATION

## 2022-10-27 NOTE — Progress Notes (Signed)
Assessment: 1. Nephrolithiasis   2. Microscopic hematuria; evaluation 1/24     Plan: I personally reviewed the KUB study from today.  She has bilateral nephrolithiasis which appears stable in comparison to a prior study from 1/24. Continue stone prevention with dietary restrictions.   Return to office in 1 year with KUB  Chief Complaint:  Chief Complaint  Patient presents with   Nephrolithiasis    History of Present Illness:  Casey Conley is a 67 y.o. female who is seen for further evaluation of bilateral nephrolithiasis, history of right ureteral calculus, gross and microscopic hematuria.  She noted onset of dark-colored urine on 12/31/2021.  This began after strenuous yard work.  No dysuria or flank pain.  No other urinary symptoms. Urinalysis was from 01/01/2022 showed 0-2 WBCs, TNTC RBCs. She reported that her urine has visibly cleared since that time.  No history of UTIs or kidney stones. No recent imaging. No history of tobacco use. Urinalysis from 01/06/2022 showed TNTC RBCs. CT hematuria protocol from 01/20/2022 showed approximately 10 stones in the right kidney, mild right-sided hydronephrosis secondary to a 4 x 5 x 7 mm stone at the right UPJ, numerous stones in the left kidney, no left hydronephrosis, 4.9 cm simple cyst upper pole right kidney. At her visit on 02/09/22, she reported 2 episodes of gross hematuria again associated with more strenuous activity.  She was not having any flank pain.  No dysuria. Cystoscopy from 02/09/22 showed no urethral or bladder abnormalities. Urine cytology was negative for malignancy.  She underwent right ESL on 02/16/22 for the proximal right ureteral calculus. She has done well since the procedure, passing small fragments and gravel.  No flank pain.  No further episodes of gross hematuria. She does have occasional stress incontinence associated with coughing and sneezing.  She does not use pads on a regular basis.  Renal ultrasound from  04/01/2022 showed bilateral renal stones, a right parapelvic cyst, no evidence of hydronephrosis.  At her visit in March 2024, she was not having any flank pain.  She had not passed any additional stone fragments.  No dysuria or gross hematuria.  Her stress incontinence had improved.   She returns today for follow-up.  She was not had any recent stone symptoms.  No flank pain.  No dysuria or gross hematuria.  Portions of the above documentation were copied from a prior visit for review purposes only.   Past Medical History:  Past Medical History:  Diagnosis Date   Abnormal Pap smear of cervix    12-29-17 neg HPV HR+ 16,18/45 neg   ANEMIA-IRON DEFICIENCY 10/29/2008   Dysuria 03/29/2009   Family history of breast cancer 03/11/2020   Family history of colon cancer 03/11/2020   Fibroid, uterine    History of fibrocystic disease of breast    OSTEOPOROSIS 10/29/2008   Osteoporosis    on Bisphosphonate for aprox 2007   Proteinuria    24 hour-normal    Past Surgical History:  Past Surgical History:  Procedure Laterality Date   BREAST LUMPECTOMY  approx 1990   benign-left   EXTRACORPOREAL SHOCK WAVE LITHOTRIPSY Right 02/16/2022   Procedure: EXTRACORPOREAL SHOCK WAVE LITHOTRIPSY (ESWL);  Surgeon: Milderd Meager., MD;  [facility];  Service: Urology;  Laterality: Right;   TONGUE SURGERY     had growth removed   UTERINE FIBROID EMBOLIZATION     s/p    Allergies:  No Known Allergies  Family History:  Family History  Problem Relation  Age of Onset   Arthritis Mother    Dementia Mother    Transient ischemic attack Mother    Osteoporosis Mother    Breast cancer Mother        dx 14s, dx 43s   Diabetes Father    Hyperlipidemia Father    Heart disease Father        CAD/CABG   Colon cancer Father 40   Lung cancer Father 34   Diabetes Paternal Grandfather    Heart disease Paternal Grandfather    Cancer Maternal Grandmother 10       unknown type; ? kidney  cancer   Uterine cancer Paternal Grandmother        or other "female cancer", dx 79s-70s   Cancer Maternal Uncle        unknown type; ? colon cancer; dx 90s   Cancer Paternal Uncle        sacral chordoma, d. 55   Colon polyps Maternal Uncle        unknown number    Social History:  Social History   Tobacco Use   Smoking status: Never   Smokeless tobacco: Never  Vaping Use   Vaping status: Never Used  Substance Use Topics   Alcohol use: No    Alcohol/week: 0.0 standard drinks of alcohol   Drug use: No    ROS: Constitutional:  Negative for fever, chills, weight loss CV: Negative for chest pain, previous MI, hypertension Respiratory:  Negative for shortness of breath, wheezing, sleep apnea, frequent cough GI:  Negative for nausea, vomiting, bloody stool, GERD  Physical exam: BP 137/84   Pulse 86   Ht 5' 5.5" (1.664 m)   Wt 150 lb (68 kg)   LMP 09/02/2004   BMI 24.58 kg/m  GENERAL APPEARANCE:  Well appearing, well developed, well nourished, NAD HEENT:  Atraumatic, normocephalic, oropharynx clear NECK:  Supple without lymphadenopathy or thyromegaly ABDOMEN:  Soft, non-tender, no masses EXTREMITIES:  Moves all extremities well, without clubbing, cyanosis, or edema NEUROLOGIC:  Alert and oriented x 3, normal gait, CN II-XII grossly intact MENTAL STATUS:  appropriate BACK:  Non-tender to palpation, No CVAT SKIN:  Warm, dry, and intact  Results: U/A: 0-5 WBC, 0-2 RBC

## 2022-11-16 ENCOUNTER — Other Ambulatory Visit (HOSPITAL_COMMUNITY): Payer: Self-pay

## 2022-11-16 ENCOUNTER — Other Ambulatory Visit: Payer: Self-pay

## 2022-11-17 ENCOUNTER — Other Ambulatory Visit (HOSPITAL_COMMUNITY): Payer: Self-pay

## 2022-12-14 ENCOUNTER — Other Ambulatory Visit (HOSPITAL_COMMUNITY): Payer: Self-pay

## 2023-01-12 ENCOUNTER — Other Ambulatory Visit (HOSPITAL_COMMUNITY): Payer: Self-pay

## 2023-01-14 ENCOUNTER — Other Ambulatory Visit (HOSPITAL_COMMUNITY): Payer: Self-pay

## 2023-01-22 ENCOUNTER — Other Ambulatory Visit (HOSPITAL_COMMUNITY): Payer: Self-pay

## 2023-02-11 ENCOUNTER — Other Ambulatory Visit (HOSPITAL_COMMUNITY): Payer: Self-pay

## 2023-02-12 ENCOUNTER — Other Ambulatory Visit: Payer: Self-pay

## 2023-02-15 ENCOUNTER — Other Ambulatory Visit (HOSPITAL_COMMUNITY): Payer: Self-pay

## 2023-03-02 ENCOUNTER — Encounter: Payer: Self-pay | Admitting: Obstetrics and Gynecology

## 2023-03-29 ENCOUNTER — Telehealth: Payer: Self-pay

## 2023-03-29 ENCOUNTER — Other Ambulatory Visit (HOSPITAL_COMMUNITY): Payer: Self-pay

## 2023-03-29 ENCOUNTER — Other Ambulatory Visit: Payer: Self-pay | Admitting: Internal Medicine

## 2023-03-29 NOTE — Telephone Encounter (Signed)
 Pharmacy Patient Advocate Encounter   Received notification from RX Request Messages that prior authorization for Wegovy 1mg /0.42ml is required/requested.   Insurance verification completed.   The patient is insured through [facility] .   Per test claim: PA required; PA submitted to above mentioned insurance via Fax Key/confirmation #/EOC -- Status is pending   Fax# [phone] Phone# [phone]

## 2023-04-01 ENCOUNTER — Telehealth: Payer: Self-pay | Admitting: Internal Medicine

## 2023-04-01 MED ORDER — WEGOVY 1.7 MG/0.75ML ~~LOC~~ SOAJ: 1.7000 mg | SUBCUTANEOUS | 2 refills | Status: DC

## 2023-04-01 NOTE — Telephone Encounter (Signed)
 Copied from CRM [id number]. Topic: Clinical - Request for Lab/Test Order >> Apr 01, 2023  2:22 PM Casey Conley wrote: Reason for CRM: Patient wants to have labs done prior to the cpx office visit 04/15/23, patient wants to discuss with Dr. Jonny Ruiz during visit. Please order and contact [phone].

## 2023-04-01 NOTE — Telephone Encounter (Signed)
 Pharmacy Patient Advocate Encounter  Received notification from [facility] that Prior Authorization for Wegovy 1mg /0.38ml has been DENIED.  Full denial letter will be uploaded to the media tab. See denial reason below.  This drug is used for weight loss or weight gain purposes, which is one of the classes not covered under the Part D coverage. We do not offer supplemental benefit that would cover this drug. Section 1927(d)(2) of the Social Security Act (the Act) permits the exclusion of certain drugs or classes of drugs from coverage under Part D.   PA #/Case ID/Reference #: --

## 2023-04-01 NOTE — Telephone Encounter (Signed)
 Copied from CRM [id number]. Topic: Clinical - Medication Question >> Apr 01, 2023  2:31 PM Orinda Kenner C wrote: Reason for CRM: Patient [phone] states she pays for Semaglutide-Weight Management [facility]) 1 MG/0.5ML SOAJ for herself and does not use insurance for coverage. Patient wants to up her dosage Semaglutide-Weight Management (WEGOVY) 1.7 MG SOAJ, patient will be paying without insurance coverage. Please advise and call back.   [facility] [address] [place] [address] Phone:[phone]

## 2023-04-01 NOTE — Telephone Encounter (Signed)
 I have to decline  The primary insurance appears to be medicare, which will not pay  I will not answer further messages about this  Please refer pt to admin for the office for further questions

## 2023-04-01 NOTE — Telephone Encounter (Signed)
 Done erx

## 2023-04-02 NOTE — Telephone Encounter (Signed)
 Called and left voice mail

## 2023-04-05 ENCOUNTER — Other Ambulatory Visit: Payer: Self-pay

## 2023-04-05 ENCOUNTER — Telehealth: Payer: Self-pay

## 2023-04-05 ENCOUNTER — Other Ambulatory Visit (HOSPITAL_COMMUNITY): Payer: Self-pay

## 2023-04-05 MED ORDER — WEGOVY 1.7 MG/0.75ML ~~LOC~~ SOAJ
1.7000 mg | SUBCUTANEOUS | 2 refills | Status: DC
  Filled 2023-04-05: qty 3, 28d supply, fill #0
  Filled 2023-05-08: qty 3, 28d supply, fill #1
  Filled 2023-07-05: qty 3, 28d supply, fill #2

## 2023-04-05 NOTE — Telephone Encounter (Signed)
 Prescription has been sent to [facility].

## 2023-04-05 NOTE — Telephone Encounter (Signed)
 Copied from CRM [id number]. Topic: Clinical - Prescription Issue >> Apr 05, 2023  1:44 PM Casey Conley wrote: Reason for CRM: Patient needed medication Semaglutide-Weight Management (WEGOVY) 1.7 MG/0.75ML SOAJ sent to  [facility] [address] [place] [address] Phone: [phone] Fax: [phone] and not [facility]

## 2023-04-08 ENCOUNTER — Other Ambulatory Visit (HOSPITAL_COMMUNITY): Payer: Self-pay

## 2023-04-13 ENCOUNTER — Other Ambulatory Visit (HOSPITAL_COMMUNITY): Payer: Self-pay

## 2023-04-15 ENCOUNTER — Ambulatory Visit (INDEPENDENT_AMBULATORY_CARE_PROVIDER_SITE_OTHER): Payer: Medicare Other | Admitting: Internal Medicine

## 2023-04-15 ENCOUNTER — Encounter: Payer: Self-pay | Admitting: Internal Medicine

## 2023-04-15 VITALS — BP 122/74 | HR 89 | Temp 98.4°F | Ht 65.5 in | Wt 149.0 lb

## 2023-04-15 DIAGNOSIS — D509 Iron deficiency anemia, unspecified: Secondary | ICD-10-CM | POA: Diagnosis not present

## 2023-04-15 DIAGNOSIS — E7800 Pure hypercholesterolemia, unspecified: Secondary | ICD-10-CM

## 2023-04-15 DIAGNOSIS — E559 Vitamin D deficiency, unspecified: Secondary | ICD-10-CM | POA: Diagnosis not present

## 2023-04-15 DIAGNOSIS — R739 Hyperglycemia, unspecified: Secondary | ICD-10-CM | POA: Diagnosis not present

## 2023-04-15 DIAGNOSIS — E538 Deficiency of other specified B group vitamins: Secondary | ICD-10-CM | POA: Diagnosis not present

## 2023-04-15 DIAGNOSIS — R9431 Abnormal electrocardiogram [ECG] [EKG]: Secondary | ICD-10-CM

## 2023-04-15 DIAGNOSIS — E669 Obesity, unspecified: Secondary | ICD-10-CM

## 2023-04-15 DIAGNOSIS — Z0001 Encounter for general adult medical examination with abnormal findings: Secondary | ICD-10-CM

## 2023-04-15 LAB — CBC WITH DIFFERENTIAL/PLATELET
Basophils Absolute: 0 10*3/uL (ref 0.0–0.1)
Basophils Relative: 0.5 % (ref 0.0–3.0)
Eosinophils Absolute: 0.1 10*3/uL (ref 0.0–0.7)
Eosinophils Relative: 1.9 % (ref 0.0–5.0)
HCT: 42.3 % (ref 36.0–46.0)
Hemoglobin: 14.2 g/dL (ref 12.0–15.0)
Lymphocytes Relative: 26.9 % (ref 12.0–46.0)
Lymphs Abs: 1.7 10*3/uL (ref 0.7–4.0)
MCHC: 33.5 g/dL (ref 30.0–36.0)
MCV: 88.9 fl (ref 78.0–100.0)
Monocytes Absolute: 0.5 10*3/uL (ref 0.1–1.0)
Monocytes Relative: 7.5 % (ref 3.0–12.0)
Neutro Abs: 4 10*3/uL (ref 1.4–7.7)
Neutrophils Relative %: 63.2 % (ref 43.0–77.0)
Platelets: 214 10*3/uL (ref 150.0–400.0)
RBC: 4.75 Mil/uL (ref 3.87–5.11)
RDW: 13.6 % (ref 11.5–15.5)
WBC: 6.3 10*3/uL (ref 4.0–10.5)

## 2023-04-15 LAB — LIPID PANEL
Cholesterol: 189 mg/dL (ref 0–200)
HDL: 95.2 mg/dL (ref >39.00)
LDL Cholesterol: 71 mg/dL (ref 0–99)
NonHDL: 93.56
Total CHOL/HDL Ratio: 2
Triglycerides: 111 mg/dL (ref 0.0–149.0)
VLDL: 22.2 mg/dL (ref 0.0–40.0)

## 2023-04-15 LAB — TSH: TSH: 2.34 u[IU]/mL (ref 0.35–5.50)

## 2023-04-15 LAB — HEPATIC FUNCTION PANEL
ALT: 28 U/L (ref 0–35)
AST: 26 U/L (ref 0–37)
Albumin: 4.4 g/dL (ref 3.5–5.2)
Alkaline Phosphatase: 83 U/L (ref 39–117)
Bilirubin, Direct: 0 mg/dL (ref 0.0–0.3)
Total Bilirubin: 0.5 mg/dL (ref 0.2–1.2)
Total Protein: 6.9 g/dL (ref 6.0–8.3)

## 2023-04-15 LAB — URINALYSIS, ROUTINE W REFLEX MICROSCOPIC
Bilirubin Urine: NEGATIVE
Ketones, ur: NEGATIVE
Leukocytes,Ua: NEGATIVE
Nitrite: NEGATIVE
Specific Gravity, Urine: 1.02 (ref 1.000–1.030)
Total Protein, Urine: NEGATIVE
Urine Glucose: NEGATIVE
Urobilinogen, UA: 0.2 (ref 0.0–1.0)
pH: 6 (ref 5.0–8.0)

## 2023-04-15 LAB — VITAMIN B12: Vitamin B-12: 250 pg/mL (ref 211–911)

## 2023-04-15 LAB — FERRITIN: Ferritin: 137.1 ng/mL (ref 10.0–291.0)

## 2023-04-15 LAB — MICROALBUMIN / CREATININE URINE RATIO
Creatinine,U: 145.1 mg/dL
Microalb Creat Ratio: 22.9 mg/g (ref 0.0–30.0)
Microalb, Ur: 3.3 mg/dL — ABNORMAL HIGH (ref 0.0–1.9)

## 2023-04-15 LAB — HEMOGLOBIN A1C: Hgb A1c MFr Bld: 5.8 % (ref 4.6–6.5)

## 2023-04-15 LAB — BASIC METABOLIC PANEL
BUN: 18 mg/dL (ref 6–23)
CO2: 28 meq/L (ref 19–32)
Calcium: 10.2 mg/dL (ref 8.4–10.5)
Chloride: 105 meq/L (ref 96–112)
Creatinine, Ser: 1.11 mg/dL (ref 0.40–1.20)
GFR: 51.34 mL/min — ABNORMAL LOW (ref >60.00)
Glucose, Bld: 80 mg/dL (ref 70–99)
Potassium: 3.7 meq/L (ref 3.5–5.1)
Sodium: 141 meq/L (ref 135–145)

## 2023-04-15 LAB — VITAMIN D 25 HYDROXY (VIT D DEFICIENCY, FRACTURES): VITD: 76.53 ng/mL (ref 30.00–100.00)

## 2023-04-15 LAB — IBC PANEL
Iron: 100 ug/dL (ref 42–145)
Saturation Ratios: 29.8 % (ref 20.0–50.0)
TIBC: 336 ug/dL (ref 250.0–450.0)
Transferrin: 240 mg/dL (ref 212.0–360.0)

## 2023-04-15 MED ORDER — ROSUVASTATIN CALCIUM 20 MG PO TABS: 20.0000 mg | ORAL_TABLET | ORAL | 3 refills | Status: DC

## 2023-04-15 NOTE — Progress Notes (Addendum)
 Patient ID: Casey Conley, female   DOB: [birth year], 68 y.o.   MRN: [id number]         Chief Complaint:: yearly exam and Annual Exam (Patient is here for physical. Patient has questions about the [facility], the side effect and other questions. Patient also wants to know if she is due for her second pneum. shot)  ,        HPI:  Casey Conley is a 68 y.o. female here overall doing ok, due for pneumovax;  Also has lost wt with wegovy 1.7 and asks to continue same dose; Denies urinary symptoms such as dysuria, frequency, urgency, flank pain, hematuria or n/v, fever, chills though has hx of renal stones and microhematuria.  Pt denies chest pain, increased sob or doe, wheezing, orthopnea, PND, increased LE swelling, palpitations, dizziness or syncope.   Pt denies polydipsia, polyuria, or new focal neuro s/s.      Wt Readings from Last 3 Encounters:  04/15/23 149 lb (67.6 kg)  10/27/22 150 lb (68 kg)  04/20/22 162 lb (73.5 kg)   BP Readings from Last 3 Encounters:  04/15/23 122/74  10/27/22 137/84  05/19/22 122/70   Immunization History  Administered Date(s) Administered   DTaP 02/02/2006   Fluad Quad(high Dose 65+) 12/20/2020   Influenza Inj Mdck Quad Pf 11/29/2017   Influenza Split 01/16/2011, 01/12/2012   Influenza Whole 10/29/2008   Influenza, High Dose Seasonal PF 11/03/2018   Influenza,inj,Quad PF,6+ Mos 12/21/2012, 12/19/2014   Influenza-Unspecified 12/04/2019, 01/04/2023   PFIZER Comirnaty(Gray Top)Covid-19 Tri-Sucrose Vaccine 04/03/2019, 05/08/2019, 12/04/2019   PNEUMOCOCCAL CONJUGATE-20 08/28/2021   Td 02/02/2005   Tdap 02/16/2017   Zoster Recombinant(Shingrix) 09/02/2017, 01/06/2018   Health Maintenance Due  Topic Date Due   Medicare Annual Wellness (AWV)  01/10/2023      Past Medical History:  Diagnosis Date   Abnormal Pap smear of cervix    12-29-17 neg HPV HR+ 16,18/45 neg   ANEMIA-IRON DEFICIENCY 10/29/2008   Dysuria 03/29/2009   Family history of breast cancer 03/11/2020    Family history of colon cancer 03/11/2020   Fibroid, uterine    History of fibrocystic disease of breast    OSTEOPOROSIS 10/29/2008   Osteoporosis    on Bisphosphonate for aprox 2007   Proteinuria    24 hour-normal   Past Surgical History:  Procedure Laterality Date   BREAST LUMPECTOMY  approx 1990   benign-left   EXTRACORPOREAL SHOCK WAVE LITHOTRIPSY Right 02/16/2022   Procedure: EXTRACORPOREAL SHOCK WAVE LITHOTRIPSY (ESWL);  Surgeon: Milderd Meager., MD;  Location: [facility];  Service: Urology;  Laterality: Right;   TONGUE SURGERY     had growth removed   UTERINE FIBROID EMBOLIZATION     s/p    reports that she has never smoked. She has never used smokeless tobacco. She reports that she does not drink alcohol and does not use drugs. family history includes Arthritis in her mother; Breast cancer in her mother; Cancer in her maternal uncle and paternal uncle; Cancer (age of onset: 29) in her maternal grandmother; Colon cancer (age of onset: 3) in her father; Colon polyps in her maternal uncle; Dementia in her mother; Diabetes in her father and paternal grandfather; Heart disease in her father and paternal grandfather; Hyperlipidemia in her father; Lung cancer (age of onset: 74) in her father; Osteoporosis in her mother; Transient ischemic attack in her mother; Uterine cancer in her paternal grandmother. No Known Allergies Current Outpatient Medications on File Prior to Visit  Medication Sig Dispense Refill   aspirin EC 81 MG tablet Take 1 tablet (81 mg total) by mouth daily. Swallow whole. 100 tablet 12   CALCIUM PO Take 1,000 mg by mouth.     Doxylamine Succinate, Sleep, (SLEEP AID PO) Take by mouth at bedtime.     Romosozumab-aqqg (EVENITY) 105 MG/1. SOSY injection Inject 105 mg into the skin every 30 (thirty) days.     Semaglutide-Weight Management 1 MG/0.5ML SOAJ Inject 1 mg into the skin once a week. 2 mL 11   Urea 45 % CREA Use as directed twice per day as  needed 255 g 2   VITAMIN D-VITAMIN K PO Take by mouth. Vitamin d 5000     Semaglutide-Weight Management (WEGOVY) 1.7 MG/0.75ML SOAJ Inject 1.7 mg into the skin once a week. (Patient not taking: Reported on 04/15/2023) 3 mL 2   No current facility-administered medications on file prior to visit.        ROS:  All others reviewed and negative.  Objective        PE:  BP 122/74 (BP Location: Left Arm, Patient Position: Sitting, Cuff Size: Normal)   Pulse 89   Temp 98.4 F (36.9 C) (Oral)   Ht 5' 5.5" (1.664 m)   Wt 149 lb (67.6 kg)   LMP 09/02/2004   SpO2 97%   BMI 24.42 kg/m                 Constitutional: Pt appears in NAD               HENT: Head: NCAT.                Right Ear: External ear normal.                 Left Ear: External ear normal.                Eyes: . Pupils are equal, round, and reactive to light. Conjunctivae and EOM are normal               Nose: without d/c or deformity               Neck: Neck supple. Gross normal ROM               Cardiovascular: Normal rate and regular rhythm.                 Pulmonary/Chest: Effort normal and breath sounds without rales or wheezing.                Abd:  Soft, NT, ND, + BS, no organomegaly               Neurological: Pt is alert. At baseline orientation, motor grossly intact               Skin: Skin is warm. No rashes, no other new lesions, LE edema - none               Psychiatric: Pt behavior is normal without agitation   Micro: none  Cardiac tracings I have personally interpreted today:  none  Pertinent Radiological findings (summarize): none   Lab Results  Component Value Date   WBC 6.3 04/15/2023   HGB 14.2 04/15/2023   HCT 42.3 04/15/2023   PLT 214.0 04/15/2023   GLUCOSE 80 04/15/2023   CHOL 189 04/15/2023   TRIG 111.0 04/15/2023   HDL 95.20 04/15/2023   LDLDIRECT 103.6 06/01/2012  LDLCALC 71 04/15/2023   ALT 28 04/15/2023   AST 26 04/15/2023   NA 141 04/15/2023   K 3.7 04/15/2023   CL 105  04/15/2023   CREATININE 1.11 04/15/2023   BUN 18 04/15/2023   CO2 28 04/15/2023   TSH 2.34 04/15/2023   HGBA1C 5.8 04/15/2023   MICROALBUR 3.3 (H) 04/15/2023   Assessment/Plan:  PRICELLA GAUGH is a 69 y.o. White or Caucasian [1] female with  has a past medical history of Abnormal Pap smear of cervix, ANEMIA-IRON DEFICIENCY (10/29/2008), Dysuria (03/29/2009), Family history of breast cancer (03/11/2020), Family history of colon cancer (03/11/2020), Fibroid, uterine, History of fibrocystic disease of breast, OSTEOPOROSIS (10/29/2008), Osteoporosis, and Proteinuria.  ANEMIA-IRON DEFICIENCY For f/u iron lab  HLD (hyperlipidemia) Lab Results  Component Value Date   LDLCALC 71 04/15/2023   uncontrolled, pt to continue current statin crestor 20 every day, also for card CT score   Hyperglycemia Lab Results  Component Value Date   HGBA1C 5.8 04/15/2023   Stable, pt to continue current medical treatment  - diet, wt control   Vitamin D deficiency Last vitamin D Lab Results  Component Value Date   VD25OH 76.53 04/15/2023   Stable, cont oral replacement   Obesity (BMI 30-39.9) Ok for contd wegovy 1.7 mg weekly  Followup: Return in about 1 year (around 04/14/2024).  Oliver Barre, MD 04/17/2023 10:36 PM [facility]

## 2023-04-15 NOTE — Progress Notes (Signed)
 The test results show that your current treatment is OK, as the tests are stable.  Please continue the same plan.  There is no other need for change of treatment or further evaluation based on these results, at this time.  thanks

## 2023-04-15 NOTE — Patient Instructions (Signed)
Please continue all other medications as before, and refills have been done if requested.  Please have the pharmacy call with any other refills you may need.  Please continue your efforts at being more active, low cholesterol diet, and weight control.  You are otherwise up to date with prevention measures today.  Please keep your appointments with your specialists as you may have planned   

## 2023-04-16 ENCOUNTER — Encounter: Payer: Self-pay | Admitting: Internal Medicine

## 2023-04-17 ENCOUNTER — Encounter: Payer: Self-pay | Admitting: Internal Medicine

## 2023-04-17 NOTE — Assessment & Plan Note (Signed)
For f/u iron lab 

## 2023-04-17 NOTE — Assessment & Plan Note (Addendum)
 Lab Results  Component Value Date   LDLCALC 71 04/15/2023   uncontrolled, pt to continue current statin crestor 20 every day, also for card CT score

## 2023-04-17 NOTE — Assessment & Plan Note (Signed)
 Lab Results  Component Value Date   HGBA1C 5.8 04/15/2023   Stable, pt to continue current medical treatment  - diet, wt control

## 2023-04-17 NOTE — Addendum Note (Signed)
 Addended by: Corwin Levins on: 04/17/2023 10:36 PM   Modules accepted: Level of Service

## 2023-04-17 NOTE — Assessment & Plan Note (Signed)
 Ok for contd wegovy 1.7 mg weekly

## 2023-04-17 NOTE — Assessment & Plan Note (Signed)
 Last vitamin D Lab Results  Component Value Date   VD25OH 76.53 04/15/2023   Stable, cont oral replacement

## 2023-04-22 ENCOUNTER — Encounter: Payer: Self-pay | Admitting: Internal Medicine

## 2023-04-22 ENCOUNTER — Other Ambulatory Visit: Payer: Self-pay | Admitting: Internal Medicine

## 2023-04-22 ENCOUNTER — Ambulatory Visit (HOSPITAL_COMMUNITY)
Admission: RE | Admit: 2023-04-22 | Discharge: 2023-04-22 | Disposition: A | Discharge status: Home / Self Care | Payer: Self-pay | Source: Ambulatory Visit | Attending: Internal Medicine | Admitting: Internal Medicine

## 2023-04-22 DIAGNOSIS — E7800 Pure hypercholesterolemia, unspecified: Secondary | ICD-10-CM | POA: Insufficient documentation

## 2023-04-22 DIAGNOSIS — R739 Hyperglycemia, unspecified: Secondary | ICD-10-CM | POA: Insufficient documentation

## 2023-04-22 DIAGNOSIS — R9431 Abnormal electrocardiogram [ECG] [EKG]: Secondary | ICD-10-CM | POA: Insufficient documentation

## 2023-04-22 DIAGNOSIS — R931 Abnormal findings on diagnostic imaging of heart and coronary circulation: Secondary | ICD-10-CM

## 2023-04-22 MED ORDER — ROSUVASTATIN CALCIUM 40 MG PO TABS: 40.0000 mg | ORAL_TABLET | Freq: Every day | ORAL | 3 refills | Status: AC

## 2023-04-29 ENCOUNTER — Encounter: Payer: Self-pay | Admitting: Cardiovascular Disease

## 2023-04-29 NOTE — Progress Notes (Unsigned)
  Cardiology Office Note:  .   Date:  04/30/2023  ID:  Casey Conley, DOB [birth year], MRN [id number] PCP: Corwin Levins, MD  [facility] Providers Cardiologist:  None    History of Present Illness: .   Casey Conley is a 68 y.o. female with hx of coronary artery calcifications and HLD  CAC score is 83 ( all located in the LAD )  which is 76th percentile for age / sex matched controls .  Hx of TIA years ago  Was started on ASA 81 mg a day  And rosuvastatin 20 mg a day   Rosuvastatin was increased to 40 mg a day after the CAC score  Her last LDL is 71   Not much regular exercise Does yard work for 8 hours a day , 1 day a week  No cp Is very sore after that   No cardio exercise per se   Fam hx  Father had CABG .  Was a smoker ,   CAD developed in his late 74s  Mother had CHF in her 60 s  Has lots of bone and joint pain  Her Evenity injection causes bone pain  She has been taking her rosuvastatin 20 mg perhaps 5-6 times a week      ROS:   Studies Reviewed: .         Risk Assessment/Calculations:             Physical Exam:   VS:  BP 126/88   Pulse 93   Ht 5' 5.5" (1.664 m)   Wt 148 lb 6.4 oz (67.3 kg)   LMP 09/02/2004   SpO2 95%   BMI 24.32 kg/m    Wt Readings from Last 3 Encounters:  04/30/23 148 lb 6.4 oz (67.3 kg)  04/15/23 149 lb (67.6 kg)  10/27/22 150 lb (68 kg)    GEN: Well nourished, well developed in no acute distress NECK: No JVD; No carotid bruits CARDIAC: RRR, no murmurs, rubs, gallops RESPIRATORY:  Clear to auscultation without rales, wheezing or rhonchi  ABDOMEN: Soft, non-tender, non-distended EXTREMITIES:  No edema; No deformity   EKG Interpretation Date/Time:  Friday April 30 2023 10:11:22 EDT Ventricular Rate:  91 PR Interval:  124 QRS Duration:  72 QT Interval:  356 QTC Calculation: 437 R Axis:   62  Text Interpretation: Normal sinus rhythm Normal ECG No previous ECGs available Confirmed by Kristeen Miss ([id number]) on  04/30/2023 10:33:11 AM    ASSESSMENT AND PLAN: .   Coronary artery calcifications:  She has a coronary calcium score of 83 which places her in the 68 percentile for age and sex matched controls.  Her last LDL was 71.  Rosuvastatin was increased to 40 mg a day.  There is she has some question as to whether or not she will tolerate the higher dose of rosuvastatin.  She has lots of bone and joint pain.  I instructed her to give [place] a call if she develops an intolerance that she thinks is due to the rosuvastatin.  If this is the case we will refer her to the lipid clinic for consideration of a PCSK9 inhibitor. I have encouraged her to start walking on a regular basis.  She will start slow and work up to hopefully 45 minutes to an hour at least 3-4 times a week.        Dispo: 1 year   Signed, Kristeen Miss, MD

## 2023-04-30 ENCOUNTER — Encounter: Payer: Self-pay | Admitting: Cardiovascular Disease

## 2023-04-30 ENCOUNTER — Ambulatory Visit: Attending: Cardiovascular Disease | Admitting: Cardiovascular Disease

## 2023-04-30 VITALS — BP 126/88 | HR 93 | Ht 65.5 in | Wt 148.4 lb

## 2023-04-30 DIAGNOSIS — Z09 Encounter for follow-up examination after completed treatment for conditions other than malignant neoplasm: Secondary | ICD-10-CM | POA: Diagnosis present

## 2023-04-30 DIAGNOSIS — Z7689 Persons encountering health services in other specified circumstances: Secondary | ICD-10-CM | POA: Insufficient documentation

## 2023-04-30 DIAGNOSIS — I2510 Atherosclerotic heart disease of native coronary artery without angina pectoris: Secondary | ICD-10-CM | POA: Diagnosis present

## 2023-04-30 DIAGNOSIS — E782 Mixed hyperlipidemia: Secondary | ICD-10-CM | POA: Insufficient documentation

## 2023-04-30 NOTE — Patient Instructions (Addendum)
 If you start having lots of muscle and joint pain on the rosuvastatin 40 mg a day please let us know.  Will consider sending you to our lipid clinic to consider starting one of the PCSK9 inhibitors instead of the rosuvastatin.  Lab Work: Lipids, ALT, BMET today If you have labs (blood work) drawn today and your tests are completely normal, you will receive your results only by: MyChart Message (if you have MyChart) OR A paper copy in the mail If you have any lab test that is abnormal or we need to change your treatment, we will call you to review the results.  Follow-Up: At [facility], you and your health needs are our priority.  As part of our continuing mission to provide you with exceptional heart care, our providers are all part of one team.  This team includes your primary Cardiologist (physician) and Advanced Practice Providers or APPs (Physician Assistants and Nurse Practitioners) who all work together to provide you with the care you need, when you need it.  Your next appointment:   1 year(s)  Provider:   Kristeen Miss, MD  We recommend signing up for the patient portal called "MyChart".  Sign up information is provided on this After Visit Summary.  MyChart is used to connect with patients for Virtual Visits (Telemedicine).  Patients are able to view lab/test results, encounter notes, upcoming appointments, etc.  Non-urgent messages can be sent to your provider as well.   To learn more about what you can do with MyChart, go to [email or web address].     1st Floor: - Lobby - Registration  - Pharmacy  - Lab - Cafe  2nd Floor: - PV Lab - Diagnostic Testing (echo, CT, nuclear med)  3rd Floor: - Vacant  4th Floor: - TCTS (cardiothoracic surgery) - AFib Clinic - Structural Heart Clinic - Vascular Surgery  - Vascular Ultrasound  5th Floor: - HeartCare Cardiology (general and EP) - Clinical Pharmacy for coumadin, hypertension, lipid, weight-loss medications,  and med management appointments    Valet parking services will be available as well.

## 2023-05-01 LAB — BASIC METABOLIC PANEL WITH GFR
BUN/Creatinine Ratio: 17 (ref 12–28)
BUN: 18 mg/dL (ref 8–27)
CO2: 23 mmol/L (ref 20–29)
Calcium: 10.4 mg/dL — ABNORMAL HIGH (ref 8.7–10.3)
Chloride: 100 mmol/L (ref 96–106)
Creatinine, Ser: 1.03 mg/dL — ABNORMAL HIGH (ref 0.57–1.00)
Glucose: 91 mg/dL (ref 70–99)
Potassium: 4.2 mmol/L (ref 3.5–5.2)
Sodium: 140 mmol/L (ref 134–144)
eGFR: 60 mL/min/{1.73_m2} (ref >59)

## 2023-05-01 LAB — LIPID PANEL
Chol/HDL Ratio: 1.7 ratio (ref 0.0–4.4)
Cholesterol, Total: 177 mg/dL (ref 100–199)
HDL: 104 mg/dL (ref >39)
LDL Chol Calc (NIH): 60 mg/dL (ref 0–99)
Triglycerides: 71 mg/dL (ref 0–149)
VLDL Cholesterol Cal: 13 mg/dL (ref 5–40)

## 2023-05-01 LAB — ALT: ALT: 33 IU/L — ABNORMAL HIGH (ref 0–32)

## 2023-05-02 ENCOUNTER — Encounter: Payer: Self-pay | Admitting: Cardiovascular Disease

## 2023-05-06 ENCOUNTER — Ambulatory Visit: Payer: Medicare Other | Admitting: Obstetrics and Gynecology

## 2023-05-06 ENCOUNTER — Ambulatory Visit (INDEPENDENT_AMBULATORY_CARE_PROVIDER_SITE_OTHER): Admitting: Radiology

## 2023-05-06 ENCOUNTER — Encounter: Payer: Self-pay | Admitting: Radiology

## 2023-05-06 ENCOUNTER — Ambulatory Visit: Payer: Medicare Other | Admitting: Radiology

## 2023-05-06 ENCOUNTER — Telehealth: Payer: Self-pay | Admitting: Genetic Counselor

## 2023-05-06 VITALS — BP 104/66 | HR 94 | Ht 63.75 in | Wt 149.8 lb

## 2023-05-06 DIAGNOSIS — Z01419 Encounter for gynecological examination (general) (routine) without abnormal findings: Secondary | ICD-10-CM

## 2023-05-06 DIAGNOSIS — Z9189 Other specified personal risk factors, not elsewhere classified: Secondary | ICD-10-CM

## 2023-05-06 DIAGNOSIS — R87810 Cervical high risk human papillomavirus (HPV) DNA test positive: Secondary | ICD-10-CM

## 2023-05-06 DIAGNOSIS — M810 Age-related osteoporosis without current pathological fracture: Secondary | ICD-10-CM

## 2023-05-06 NOTE — Telephone Encounter (Signed)
 Patient called to schedule genetic testing only. I reached out to Twana First the counselor to assist with setting appt. Cari will schedule and call patient of appt.

## 2023-05-06 NOTE — Telephone Encounter (Signed)
 Received message from [place].  Patient had genetic counseling in 2022 and is now interested in proceeding with genetic testing.  Appt scheduled to briefly review testing prior to scheduling lab work-- 4/4 at 10am

## 2023-05-06 NOTE — Progress Notes (Signed)
   Casey Conley [facility] 28-May-1955 [id number]   History: Postmenopausal 68 y.o. presents for annual exam. Risk of breast cancer 22% saw a genetic counselor in 2022, did not have the testing done, just risk calculation. She is considering  having the genetic testing done. Mammos yearly, MRI every other year. No new gyn concerns. Osteoporosis managed with Prol[facility] by [facility].     Gynecologic History Postmenopausal Last Pap: 4/22. Results were: normal Last mammogram: 1/25. Results were: normal Last colonoscopy: 2023 DEXA:07/2022   Obstetric History OB History  Gravida Para Term Preterm AB Living  0 0 0 0 0 0  SAB IAB Ectopic Multiple Live Births  0 0 0 0        04/15/2023    1:21 PM 04/20/2022    1:37 PM 01/09/2022   12:43 PM  Depression screen PHQ 2/9  Decreased Interest 0 0 0  Down, Depressed, Hopeless 0 0 0  PHQ - 2 Score 0 0 0     The following portions of the patient's history were reviewed and updated as appropriate: allergies, current medications, past family history, past medical history, past social history, past surgical history, and problem list.  Review of Systems Pertinent items noted in HPI and remainder of comprehensive ROS otherwise negative.  Past medical history, past surgical history, family history and social history were all reviewed and documented in the EPIC chart.  Exam:  Vitals:   05/06/23 1347  BP: 104/66  Pulse: 94  SpO2: 99%  Weight: 149 lb 12.8 oz (67.9 kg)  Height: 5' 3.75" (1.619 m)   Body mass index is 25.92 kg/m.  General appearance:  Normal Thyroid:  Symmetrical, normal in size, without palpable masses or nodularity. Respiratory  Auscultation:  Clear without wheezing or rhonchi Cardiovascular  Auscultation:  Regular rate, without rubs, murmurs or gallops  Edema/varicosities:  Not grossly evident Abdominal  Soft,nontender, without masses, guarding or rebound.  Liver/spleen:  No organomegaly noted  Hernia:  None appreciated  Skin  Inspection:   Grossly normal Breasts: Examined lying and sitting.   Right: Without masses, retractions, nipple discharge or axillary adenopathy.   Left: Without masses, retractions, nipple discharge or axillary adenopathy. Genitourinary   Inguinal/mons:  Normal without inguinal adenopathy  External genitalia:  Normal appearing vulva with no masses, tenderness, or lesions  BUS/Urethra/Skene's glands:  Normal  Vagina:  Normal appearing with normal color and discharge, no lesions. Atrophy: mild   Cervix:  Normal appearing without discharge or lesions  Uterus:  Normal in size, shape and contour.  Midline and mobile, nontender  Adnexa/parametria:     Rt: Normal in size, without masses or tenderness.   Lt: Normal in size, without masses or tenderness.  Anus and perineum: Normal    Casey Conley, CMA present for exam  Assessment/Plan:   1. Encounter for breast and pelvic examination (Primary) Pap 2027  2. At increased risk of breast cancer Yearly mammo and clinical breast exam. MRI due 2026   RTO 1 year for breast exam. 2 years for B&P  Casey Conley B WHNP-BC, 2:22 PM 05/06/2023

## 2023-05-07 ENCOUNTER — Encounter: Payer: Self-pay | Admitting: Genetic Counselor

## 2023-05-07 ENCOUNTER — Inpatient Hospital Stay: Attending: Genetic Counselor | Admitting: Genetic Counselor

## 2023-05-07 DIAGNOSIS — Z800 Family history of malignant neoplasm of digestive organs: Secondary | ICD-10-CM | POA: Diagnosis not present

## 2023-05-07 DIAGNOSIS — Z803 Family history of malignant neoplasm of breast: Secondary | ICD-10-CM | POA: Diagnosis not present

## 2023-05-07 DIAGNOSIS — Z1379 Encounter for other screening for genetic and chromosomal anomalies: Secondary | ICD-10-CM

## 2023-05-07 NOTE — Progress Notes (Addendum)
 REFERRING PROVIDER: Romualdo Bolk, MD [address] [place],  [place] [address]  PRIMARY PROVIDER:  Corwin Levins, MD  PRIMARY REASON FOR VISIT:  Encounter Diagnoses  Name Primary?   Family history of breast cancer Yes   Family history of colon cancer     I connected with Ms. Pounds on 05/07/2023 at 10AM ET by telephone and verified that I am speaking with the correct person using two identifiers.   Patient location: home Provider location: [facility] office   HISTORY OF PRESENT ILLNESS:   Ms. Ballow, a 68 y.o. female, was seen for a [facility] cancer genetics consultation at the request of Dr. Oscar La due to a family history of breast and other cancers.  She was seen by our office in 2022 to discuss the possibility of a hereditary predisposition to cancer, to discuss genetic testing, and to further clarify her future cancer risks, as well as potential cancer risks for family members.  At that time, she declined testing but was recommended to consider high risk breast screening based on her estimated lifetime risk for breast cancer by risk models.  She states that she was now  interested in proceeding with genetic testing.     Ms. Ober is a 68 y.o. female with no personal history of cancer.     CANCER HISTORY:  Oncology History   No history exists.    Screening/risk factors Menarche was at age 49 or 55.  Nulliparous.  OCP use for approximately 0 years.  Ovaries intact: yes.  Hysterectomy: no.  Menopausal status: postmenopausal.  LMP in 2006. HRT use: 0 years. Colonoscopy: Most recent in 2023.  Patient reported some polyps.  F/u in 5 years.  Mammogram within the last year: yes; Jan 2025; category C density  Breast MRI in July 2024 due to estimated lifetime risk for breast cancer.  Plans to obtain for every other year.  Breast biopsies: in late 20s; benign per patient.   Past Medical History:  Diagnosis Date   Abnormal Pap smear of cervix    12-29-17 neg HPV  HR+ 16,18/45 neg   ANEMIA-IRON DEFICIENCY 10/29/2008   Atherosclerosis    Dysuria 03/29/2009   Family history of breast cancer 03/11/2020   Family history of colon cancer 03/11/2020   Fibroid, uterine    History of fibrocystic disease of breast    OSTEOPOROSIS 10/29/2008   Osteoporosis    on Bisphosphonate for aprox 2007   Proteinuria    24 hour-normal    Past Surgical History:  Procedure Laterality Date   BREAST LUMPECTOMY  approx 1990   benign-left   EXTRACORPOREAL SHOCK WAVE LITHOTRIPSY Right 02/16/2022   Procedure: EXTRACORPOREAL SHOCK WAVE LITHOTRIPSY (ESWL);  Surgeon: Milderd Meager., MD;  Location: [facility];  Service: Urology;  Laterality: Right;   TONGUE SURGERY     had growth removed   UTERINE FIBROID EMBOLIZATION     s/p     FAMILY HISTORY:  We obtained a detailed, 4-generation family history.  Significant diagnoses are listed below: Family History  Problem Relation Age of Onset   Breast cancer Mother        dx 61s, dx 76s   Colon cancer Father 40   Lung cancer Father 16   Diabetes Paternal Grandfather    Heart disease Paternal Grandfather    Cancer Maternal Grandmother 75       unknown type; ? kidney cancer   Uterine cancer Paternal Grandmother  or other "female cancer", dx 60s-70s   Cancer Maternal Uncle        unknown type; ? colon cancer; dx 90s   Cancer Paternal Uncle        sacral chordoma, d. 4   Colon polyps Maternal Uncle        unknown number       Ms. Blow does not have children or siblings.     Her mother was diagnosed with breast cancer in her 93s and again in her 71s.  Ms. Calip maternal uncle had colon polyps (unknown number).  Her other maternal uncle had an unknown cancer, possibly colon cancer, diagnosed in his 37s.  Ms. Lao maternal grandmother had an unknown type of cancer, possibly kidney cancer, diagnosed at age 50.  No other maternal family history of cancer was reported.    Ms. Geraci father  died at age 21 and had a history of lung cancer and colon cancer at age 28.  Ms. Martine paternal uncle had a history of sacral chordroma. Ms. Keng paternal grandmother had a "female cancer", most likely uterine cancer, diagnosed in her 41s or 46s. No other paternal family history of cancer was reported.    Ms. Correia is unaware of previous family history of genetic testing for hereditary cancer risks. Patient's maternal ancestors are of [place] and [place] descent, and paternal ancestors are of [place], [place], and [place] descent. There reported maternal and paternal Jewish ancestry. There is no known consanguinity.  GENETIC COUNSELING ASSESSMENT: Ms. Barhorst is a 68 y.o. female with a family history of breast cancer and Jewish ancestry which is somewhat suggestive of a hereditary cancer syndrome and predisposition to cancer. We, therefore, discussed and recommended the following at today's visit.   DISCUSSION: We discussed that 5 - 10% of cancer is hereditary, with most cases of hereditary breast cancer associated with mutations in BRCA1/2.  Most hereditary colon and uterine cancer is associated with mutation in the Lynch syndrome genes.   There are other genes that can be associated with hereditary breast, colon, uterine, and other cancer syndromes.  We discussed that testing is beneficial for several reasons, including knowing about other cancer risks, identifying potential screening and risk-reduction options that may be appropriate, and understanding if other family members could be at an increased risk for cancer and allowing them to undergo genetic testing.  We reviewed the characteristics, features and inheritance patterns of hereditary cancer syndromes. We also discussed genetic testing, including the appropriate family members to test, the process of testing, insurance coverage and turn-around-time for results. We discussed the implications of a negative, positive, and variant of uncertain  significant result. We discussed that negative results would be uninformative given that Ms. Beharry does not have a personal history of cancer. We recommended Ms. Elenbaas pursue genetic testing for a panel that contains genes associated with breast, colon, uterine, and other cancers.  Ms. Boule was offered a common hereditary cancer panel (~40 genes) and an expanded pan-cancer panel (~70 genes). Ms. Gruen was informed of the benefits and limitations of each panel, including that expanded pan-cancer panels contain several genes that do not have clear management guidelines at this point in time.  We also discussed that as the number of genes included on a panel increases, the chances of variants of uncertain significance increases.  After considering the benefits and limitations of each gene panel, Ms. Cumbie elected to have a common hereditary cancers panel through [employer]  She requested addition of melanoma  genes to the panel.   The [facility] CustomNext-Cancer +RNAinsight Panel (CancerNext + kidney cancer genes + melanoma genes + EGFR) includes sequencing, deletion/duplication, and RNA analysis for the following 49 genes: APC, ATM, AXIN2, BAP1, BARD1, BMPR1A, BRCA1, BRCA2, BRIP1, CDH1, CDK4, CDKN2A, CHEK2, EGFR, EPCAM, FH, FLCN, GREM1, HOXB13, MAX, MBD4, MET, MITF, MLH1, MSH2, MSH3, MSH6, MUTYH, NF1, NTHL1, PALB2, PMS2, POLD1, POLE, POT1, PTEN, RAD51C, RAD51D, RB1, SDHA, SDHB, SDHC, SDHD, SMAD4, STK11, TP53, TSC1, TSC2, VHL.  Based on Ms. Dombrosky's family history of breast cancer and Jewish ancestry, she meets NCCN medical criteria for genetic testing.  Most of her relatives are deceased and are not available for genetic testing.    We discussed the Genetic Information Non-Discrimination Act (GINA) of 2008, which helps protect individuals against genetic discrimination based on their genetic test results.  It impacts both health insurance and employment.  With health insurance, it protects against genetic test  results being used for increased premiums or policy termination. For employment, it protects against hiring, firing and promoting decisions based on genetic test results.  GINA does not apply to those in the [employer], those who work for companies with less than 15 employees, and new life insurance or long-term disability insurance policies.  Health status due to a cancer diagnosis is not protected under GINA.  PLAN: After considering the risks, benefits, and limitations, Ms. Golay provided informed consent to pursue genetic testing.  The blood sample will be collected during lab appointment on 4/17 at 1pm and sent to [facility] for analysis of the CustomNext-Cancer +RNAinsight Panel. Results should be available within approximately 2-3 weeks' time, at which point they will be disclosed by telephone to Ms. Tony, as will any additional recommendations warranted by these results. Ms. Panozzo will receive a summary of her genetic counseling visit and a copy of her results once available. This information will also be available in Epic.   Ms. Croker questions were answered to her satisfaction today. Our contact information was provided should additional questions or concerns arise. Thank you for the referral and allowing [place] to share in the care of your patient.   Bryston Colocho M. Rennie Plowman, MS, [facility] Genetic Counselor [email or web address] (P) [phone]    20 minutes were spent on the date of the encounter in service to the patient including preparation, telephone consultation, documentation and care coordination.  The patient was seen alone.  Drs. Gilman Buttner and/or Lewis were available to discuss this case as needed.   _______________________________________________________________________ For Office Staff:  Number of people involved in session: 1 Was an Intern/ student involved with case: no

## 2023-05-10 ENCOUNTER — Other Ambulatory Visit (HOSPITAL_COMMUNITY): Payer: Self-pay

## 2023-05-20 ENCOUNTER — Ambulatory Visit (INDEPENDENT_AMBULATORY_CARE_PROVIDER_SITE_OTHER)

## 2023-05-20 ENCOUNTER — Inpatient Hospital Stay

## 2023-05-20 VITALS — Ht 64.5 in | Wt 149.0 lb

## 2023-05-20 DIAGNOSIS — Z0000 Encounter for general adult medical examination without abnormal findings: Secondary | ICD-10-CM

## 2023-05-20 DIAGNOSIS — Z0100 Encounter for examination of eyes and vision without abnormal findings: Secondary | ICD-10-CM

## 2023-05-20 DIAGNOSIS — Z803 Family history of malignant neoplasm of breast: Secondary | ICD-10-CM

## 2023-05-20 LAB — GENETIC SCREENING ORDER

## 2023-05-20 NOTE — Patient Instructions (Addendum)
 Casey Conley , Thank you for taking time to come for your Medicare Wellness Visit. I appreciate your ongoing commitment to your health goals. Please review the following plan we discussed and let me know if I can assist you in the future.   Referrals/Orders/Follow-Ups/Clinician Recommendations: Aim for 30 minutes of exercise or brisk walking, 6-8 glasses of water, and 5 servings of fruits and vegetables each day. Referral sent to [facility].  This is a list of the screening recommended for you and due dates:  Health Maintenance  Topic Date Due   COVID-19 Vaccine (4 - 2024-25 season) 10/04/2022   Flu Shot  09/03/2023   Medicare Annual Wellness Visit  05/19/2024   Mammogram  02/28/2025   DTaP/Tdap/Td vaccine (4 - Td or Tdap) 02/17/2027   Colon Cancer Screening  06/22/2027   Pneumonia Vaccine  Completed   DEXA scan (bone density measurement)  Completed   Hepatitis C Screening  Completed   Zoster (Shingles) Vaccine  Completed   HPV Vaccine  Aged Out   Meningitis B Vaccine  Aged Out    Advanced directives: (Copy Requested) Please bring a copy of your health care power of attorney and living will to the office to be added to your chart at your convenience. You can mail to [facility] [address] [place], [place] [address] or email to [email or web address]  Next Medicare Annual Wellness Visit scheduled for next year: Yes

## 2023-05-20 NOTE — Progress Notes (Signed)
 Subjective:   Casey Conley is a 68 y.o. who presents for a Medicare Wellness preventive visit.  Visit Complete: Virtual I connected with  [name] on 05/20/23 by a audio enabled telemedicine application and verified that I am speaking with the correct person using two identifiers.  Patient Location: Home  Provider Location: Office/Clinic  I discussed the limitations of evaluation and management by telemedicine. The patient expressed understanding and agreed to proceed.  Vital Signs: Because this visit was a virtual/telehealth visit, some criteria may be missing or patient reported. Any vitals not documented were not able to be obtained and vitals that have been documented are patient reported.  VideoDeclined- This patient declined Librarian, academic. Therefore the visit was completed with audio only.  Persons Participating in Visit: Patient.  AWV Questionnaire: No: Patient Medicare AWV questionnaire was not completed prior to this visit.  Cardiac Risk Factors include: advanced age (>18men, >36 women);dyslipidemia     Objective:    Today's Vitals   05/20/23 1033  Weight: 149 lb (67.6 kg)  Height: 5' 4.5" (1.638 m)   Body mass index is 25.18 kg/m.     05/20/2023   10:32 AM 02/16/2022   12:06 PM 01/09/2022   12:48 PM  Advanced Directives  Does Patient Have a Medical Advance Directive? Yes Yes Yes  Type of Estate agent of [place];Living will  Healthcare Power of [place];Living will  Does patient want to make changes to medical advance directive?  No - Patient declined No - Patient declined  Copy of Healthcare Power of Attorney in Chart? No - copy requested  No - copy requested    Current Medications (verified) Outpatient Encounter Medications as of 05/20/2023  Medication Sig   aspirin EC 81 MG tablet Take 1 tablet (81 mg total) by mouth daily. Swallow whole.   CALCIUM PO Take 1,000 mg by mouth.   Doxylamine Succinate,  Sleep, (SLEEP AID PO) Take by mouth at bedtime.   Romosozumab-aqqg (EVENITY) 105 MG/1. SOSY injection Inject 105 mg into the skin every 30 (thirty) days.   rosuvastatin (CRESTOR) 40 MG tablet Take 1 tablet (40 mg total) by mouth daily.   Semaglutide-Weight Management (WEGOVY) 1.7 MG/0.75ML SOAJ Inject 1.7 mg into the skin once a week.   Urea 45 % CREA Use as directed twice per day as needed   VITAMIN D-VITAMIN K PO Take by mouth. Vitamin d 5000   [DISCONTINUED] Semaglutide-Weight Management 1 MG/0.5ML SOAJ Inject 1 mg into the skin once a week.   No facility-administered encounter medications on file as of 05/20/2023.    Allergies (verified) Patient has no known allergies.   History: Past Medical History:  Diagnosis Date   Abnormal Pap smear of cervix    12-29-17 neg HPV HR+ 16,18/45 neg   ANEMIA-IRON DEFICIENCY 10/29/2008   Atherosclerosis    Dysuria 03/29/2009   Family history of breast cancer 03/11/2020   Family history of colon cancer 03/11/2020   Fibroid, uterine    History of fibrocystic disease of breast    OSTEOPOROSIS 10/29/2008   Osteoporosis    on Bisphosphonate for aprox 2007   Proteinuria    24 hour-normal   Past Surgical History:  Procedure Laterality Date   BREAST LUMPECTOMY  approx 1990   benign-left   EXTRACORPOREAL SHOCK WAVE LITHOTRIPSY Right 02/16/2022   Procedure: EXTRACORPOREAL SHOCK WAVE LITHOTRIPSY (ESWL);  Surgeon: Milderd Meager., MD;  Location: [facility];  Service: Urology;  Laterality: Right;  TONGUE SURGERY     had growth removed   UTERINE FIBROID EMBOLIZATION     s/p   Family History  Problem Relation Age of Onset   Arthritis Mother    Dementia Mother    Transient ischemic attack Mother    Osteoporosis Mother    Breast cancer Mother        dx 67s, dx 91s   Diabetes Father    Hyperlipidemia Father    Heart disease Father        CAD/CABG   Colon cancer Father 94   Lung cancer Father 79   Cancer Maternal  Uncle        unknown type; ? colon cancer; dx 90s   Colon polyps Maternal Uncle        unknown number   Cancer Paternal Uncle        sacral chordoma, d. 56   Cancer Maternal Grandmother 37       unknown type; ? kidney cancer   Uterine cancer Paternal Grandmother        dx 60s-70s   Diabetes Paternal Grandfather    Heart disease Paternal Grandfather    Social History   Socioeconomic History   Marital status: Married    Spouse name: Not on file   Number of children: 0   Years of education: Not on file   Highest education level: Bachelor's degree (e.g., BA, AB, BS)  Occupational History   Occupation: Teacher, early years/pre  Tobacco Use   Smoking status: Never    Passive exposure: Never   Smokeless tobacco: Never  Vaping Use   Vaping status: Never Used  Substance and Sexual Activity   Alcohol use: Not Currently   Drug use: No   Sexual activity: Not Currently    Partners: Male    Birth control/protection: Post-menopausal    Comment: menarche ? 68yo, low risk Medicare, no to all screening questions.  Other Topics Concern   Not on file  Social History Narrative   No siblings    Social Drivers of Corporate investment banker Strain: Low Risk  (05/20/2023)   Overall Financial Resource Strain (CARDIA)    Difficulty of Paying Living Expenses: Not hard at all  Food Insecurity: No Food Insecurity (05/20/2023)   Hunger Vital Sign    Worried About Running Out of Food in the Last Year: Never true    Ran Out of Food in the Last Year: Never true  Transportation Needs: No Transportation Needs (05/20/2023)   PRAPARE - Administrator, Civil Service (Medical): No    Lack of Transportation (Non-Medical): No  Physical Activity: Insufficiently Active (05/20/2023)   Exercise Vital Sign    Days of Exercise per Week: 2 days    Minutes of Exercise per Session: 60 min  Stress: No Stress Concern Present (05/20/2023)   [employer] of Occupational Health -  Occupational Stress Questionnaire    Feeling of Stress : Not at all  Social Connections: Moderately Isolated (05/20/2023)   Social Connection and Isolation Panel [NHANES]    Frequency of Communication with Friends and Family: More than three times a week    Frequency of Social Gatherings with Friends and Family: More than three times a week    Attends Religious Services: Never    Database administrator or Organizations: No    Attends Banker Meetings: Never    Marital Status: Married    Tobacco Counseling Counseling given: No  Clinical Intake:  Pre-visit preparation completed: Yes  Pain : No/denies pain     BMI - recorded: 25.18 Nutritional Status: BMI 25 -29 Overweight Nutritional Risks: None Diabetes: No  Lab Results  Component Value Date   HGBA1C 5.8 04/15/2023   HGBA1C 6.2 01/01/2022   HGBA1C 6.2 03/05/2020     How often do you need to have someone help you when you read instructions, pamphlets, or other written materials from your doctor or pharmacy?: 1 - Never  Interpreter Needed?: No  Information entered by :: Kandy Orris, CMA   Activities of Daily Living     05/20/2023   10:39 AM  In your present state of health, do you have any difficulty performing the following activities:  Hearing? 0  Vision? 0  Difficulty concentrating or making decisions? 0  Walking or climbing stairs? 0  Dressing or bathing? 0  Doing errands, shopping? 0  Preparing Food and eating ? N  Using the Toilet? N  In the past six months, have you accidently leaked urine? N  Do you have problems with loss of bowel control? N  Managing your Medications? N  Managing your Finances? N  Housekeeping or managing your Housekeeping? N    Patient Care Team: Roslyn Coombe, MD as PCP - Ricarda Challenger, MD as Consulting Physician (Ophthalmology)  Indicate any recent Medical Services you may have received from other than [facility] providers in the past year (date may be  approximate).     Assessment:   This is a routine wellness examination for Cindee.  Hearing/Vision screen Hearing Screening - Comments:: Denies hearing difficulties   Vision Screening - Comments:: Wears rx glasses - referral to Dr Carloyn Chi [facility])   Goals Addressed               This Visit's Progress     Patient Stated (pt-stated)        Patient stated she plans to watch her heart medical concerns and walk more.       Depression Screen     05/20/2023   10:46 AM 04/15/2023    1:21 PM 04/20/2022    1:37 PM 01/09/2022   12:43 PM 08/28/2021    3:50 PM 03/12/2020    1:13 PM 09/07/2018    4:21 PM  PHQ 2/9 Scores  PHQ - 2 Score 0 0 0 0 0 0 1  PHQ- 9 Score 2    3      Fall Risk     05/20/2023   10:40 AM 04/15/2023    1:20 PM 04/20/2022    1:37 PM 01/09/2022   12:48 PM 08/28/2021    3:50 PM  Fall Risk   Falls in the past year? 1 1 0 0 0  Number falls in past yr: 0 0 0 0 0  Comment 1 Broken Left ankle back in March 2024     Injury with Fall? 1 0 0 0 0  Comment left ankle - fracture      Risk for fall due to :  No Fall Risks No Fall Risks No Fall Risks No Fall Risks  Follow up Falls evaluation completed;Falls prevention discussed Falls evaluation completed Falls evaluation completed Falls evaluation completed Falls evaluation completed    MEDICARE RISK AT HOME:  Medicare Risk at Home Any stairs in or around the home?: Yes If so, are there any without handrails?: No Home free of loose throw rugs in walkways, pet beds, electrical cords, etc?: Yes  Adequate lighting in your home to reduce risk of falls?: Yes Life alert?: No Use of a cane, walker or w/c?: No Grab bars in the bathroom?: Yes Shower chair or bench in shower?: No Elevated toilet seat or a handicapped toilet?: No  TIMED UP AND GO:  Was the test performed?  No  Cognitive Function: 6CIT completed        05/20/2023   10:43 AM 01/09/2022   12:48 PM  6CIT Screen  What Year? 0 points 0 points  What month? 0  points 0 points  What time? 0 points 0 points  Count back from 20 0 points 0 points  Months in reverse 0 points 0 points  Repeat phrase 0 points 0 points  Total Score 0 points 0 points    Immunizations Immunization History  Administered Date(s) Administered   DTaP 02/02/2006   Fluad Quad(high Dose 65+) 12/20/2020   Influenza Inj Mdck Quad Pf 11/29/2017   Influenza Split 01/16/2011, 01/12/2012   Influenza Whole 10/29/2008   Influenza, High Dose Seasonal PF 11/03/2018   Influenza,inj,Quad PF,6+ Mos 12/21/2012, 12/19/2014   Influenza-Unspecified 12/04/2019, 01/04/2023   PFIZER Comirnaty(Gray Top)Covid-19 Tri-Sucrose Vaccine 04/03/2019, 05/08/2019, 12/04/2019   PNEUMOCOCCAL CONJUGATE-20 08/28/2021   Td 02/02/2005   Tdap 02/16/2017   Zoster Recombinant(Shingrix) 09/02/2017, 01/06/2018    Screening Tests Health Maintenance  Topic Date Due   COVID-19 Vaccine (4 - 2024-25 season) 10/04/2022   INFLUENZA VACCINE  09/03/2023   Medicare Annual Wellness (AWV)  05/19/2024   MAMMOGRAM  02/28/2025   DTaP/Tdap/Td (4 - Td or Tdap) 02/17/2027   Colonoscopy  06/22/2027   Pneumonia Vaccine 56+ Years old  Completed   DEXA SCAN  Completed   Hepatitis C Screening  Completed   Zoster Vaccines- Shingrix  Completed   HPV VACCINES  Aged Out   Meningococcal B Vaccine  Aged Out    Health Maintenance  Health Maintenance Due  Topic Date Due   COVID-19 Vaccine (4 - 2024-25 season) 10/04/2022   Health Maintenance Items Addressed: Referral sent to Optometry/Ophthalmology - Dr Zenaida Niece w[facility]  Additional Screening:  Vision Screening: Recommended annual ophthalmology exams for early detection of glaucoma and other disorders of the eye. Referral sent to Dr Zenaida Niece - it has been >27yrs since the pt has had an eye exam.  Dental Screening: Recommended annual dental exams for proper oral hygiene  Community Resource Referral / Chronic Care Management: CRR required this visit?  No   CCM required  this visit?  No     Plan:     I have personally reviewed and noted the following in the patient's chart:   Medical and social history Use of alcohol, tobacco or illicit drugs  Current medications and supplements including opioid prescriptions. Patient is not currently taking opioid prescriptions. Functional ability and status Nutritional status Physical activity Advanced directives List of other physicians Hospitalizations, surgeries, and ER visits in previous 12 months Vitals Screenings to include cognitive, depression, and falls Referrals and appointments  In addition, I have reviewed and discussed with patient certain preventive protocols, quality metrics, and best practice recommendations. A written personalized care plan for preventive services as well as general preventive health recommendations were provided to patient.     Darreld Mclean, CMA   05/20/2023   After Visit Summary: (MyChart) Due to this being a telephonic visit, the after visit summary with patients personalized plan was offered to patient via MyChart   Notes: Nothing significant to report at this time.

## 2023-06-02 ENCOUNTER — Ambulatory Visit: Payer: Self-pay | Admitting: Genetic Counselor

## 2023-06-02 DIAGNOSIS — Z803 Family history of malignant neoplasm of breast: Secondary | ICD-10-CM

## 2023-06-02 DIAGNOSIS — Z1379 Encounter for other screening for genetic and chromosomal anomalies: Secondary | ICD-10-CM

## 2023-06-02 DIAGNOSIS — Z800 Family history of malignant neoplasm of digestive organs: Secondary | ICD-10-CM

## 2023-06-02 NOTE — Progress Notes (Signed)
 HPI:   Casey Conley was previously seen in the [facility] due to a family history of breast and other cancers and concerns regarding a hereditary predisposition to cancer.    Casey Conley recent genetic test results were disclosed to her by telephone. These results and recommendations are discussed in more detail below.  CANCER HISTORY:  Oncology History   No history exists.         Casey Conley does not have children or siblings.     Her mother was diagnosed with breast cancer in her 85s and again in her 10s.  Casey Conley maternal uncle had colon polyps (unknown number).  Her other maternal uncle had an unknown cancer, possibly colon cancer, diagnosed in his 70s.  Casey Conley maternal grandmother had an unknown type of cancer, possibly kidney cancer, diagnosed at age 76.  No other maternal family history of cancer was reported.    Casey Conley father died at age 15 and had a history of lung cancer and colon cancer at age 5.  Casey Conley paternal uncle had a history of sacral chordroma. Casey Conley paternal grandmother had a "female cancer", most likely uterine cancer, diagnosed in her 30s or 78s. No other paternal family history of cancer was reported.    Casey Conley is unaware of previous family history of genetic testing for hereditary cancer risks. Patient's maternal ancestors are of [place] descent, and paternal ancestors are of [place], [place], and [place] descent. There reported maternal and paternal Jewish ancestry. There is no known consanguinity.    GENETIC TEST RESULTS:  The [facility] CustomNext-Cancer +RNAinsight Panel found no pathogenic mutations.  The [facility] CustomNext-Cancer +RNAinsight Panel (CancerNext + kidney cancer genes + melanoma genes + EGFR) includes sequencing, deletion/duplication, and RNA analysis for the following 49 genes:  APC, ATM, BAP1, BARD1, BMPR1A, BRCA1, BRCA2, BRIP1, CDH1, CDK4, CDKN2A, CHEK2, FH, FLCN, MAX, MET, MLH1, MSH2, MSH6, MUTYH,  NF1, NTHL1, PALB2, PMS2, POT1, PTEN, RAD51C, RAD51D, RB1, SDHA, SDHB, SDHC, SDHD, SMAD4, STK11, TP53, TSC1, TSC2 and VHL (sequencing and deletion/duplication); AXIN2, EGFR, HOXB13, MBD4, MITF, MSH3, POLD1 and POLE (sequencing only); EPCAM and GREM1 (deletion/duplication only). RNA data is routinely analyzed for use in variant interpretation for all genes.  The test report has been scanned into EPIC and is located under the Molecular Pathology section of the Results Review tab.  A portion of the result report is included below for reference. Genetic testing reported out on May 30, 2023.     Even though a pathogenic variant was not identified, possible explanations for the cancer in the family may include: There may be no hereditary risk for cancer in the family. The cancers in Casey Conley's family may be sporadic/familial or due to other genetic and environmental factors.  Most cancer is not hereditary.  There may be a gene mutation in one of these genes that current testing methods cannot detect but that chance is small. There could be another gene that has not yet been discovered, or that we have not yet tested, that is responsible for the cancer diagnoses in the family.  It is also possible there is a hereditary cause for the cancer in the family that Casey Conley did not inherit.   Therefore, it is important to remain in touch with cancer genetics in the future so that we can continue to offer Ms. Veronica the most up to date genetic testing.    ADDITIONAL GENETIC TESTING:   Casey Conley genetic testing  was fairly extensive.  If there are additional relevant genes identified to increase cancer risk that can be analyzed in the future, we would be happy to discuss and coordinate this testing at that time.     CANCER SCREENING RECOMMENDATIONS:  Casey Conley test result is considered negative (normal).  This means that we have not identified a hereditary cause for her family history of cancer at this time.    An individual's cancer risk and medical management are not determined by genetic test results alone. Overall cancer risk assessment incorporates additional factors, including personal medical history, family history, and any available genetic information that may result in a personalized plan for cancer prevention and surveillance. Therefore, it is recommended she continue to follow the cancer management and screening guidelines provided by her oncology and primary healthcare provider.  Casey Conley has been determined to have an elevated risk for breast cancer. Her remaining lifetime risk for breast cancer based on Tyrer-Cuzick risk model is 17.8%, which is higher than the average risk for breast cancer for her age group (5.2%).  We discussed that this estimate suggests that she is no longer in the 'at high risk for breast cancer' category defined by NCCN (>20% lifetime risk for breast cancer).  We discussed that as age increases, remaining lifetime risk for breast cancer decreases.  Per NCCN, individuals with a residual lifetime risk of 15-20% may be considered for supplemental screening, such as breast MRIs, on an individual basis.  We discussed this may be reasonable given her elevated breast cancer risk and breast density.  However, she should speak with her providers to develop an appropriate plan.     Given her father's history of colon cancer, she should have colonoscopies at least every 5 years, or as recommended by her GI providers.   RECOMMENDATIONS FOR FAMILY MEMBERS:   Individuals in this family might be at some increased risk of developing cancer, over the general population risk, due to the family history of cancer.  Individuals in the family should notify their providers of the family history of cancer. We recommend women in this family have a yearly mammogram beginning at age 54, or 42 years younger than the earliest onset of cancer, an annual clinical breast exam, and perform monthly  breast self-exams.  Risk models that take into account family history and hormonal history may be helpful in determining appropriate breast cancer screening options for family members.   First degree relatives of those with colon cancer should receive colonoscopies beginning at age 84, or 10 years prior to the earliest diagnosis of colon cancer in the family, and receive colonoscopies at least every 5 years, or as recommended by their gastroenterologist.     FOLLOW-UP:  Cancer genetics is a rapidly advancing field and it is possible that new genetic tests will be appropriate for her and/or her family members in the future. We encourage Ms. Dubray to remain in contact with cancer genetics, so we can update her personal and family histories and let her know of advances in cancer genetics that may benefit this family.   Our contact number was provided.  They are welcome to call us  at anytime with additional questions or concerns.   Rory Xiang M. Ora Billing, MS, [facility] Genetic Counselor [email or web address] (P) [phone]

## 2023-06-14 ENCOUNTER — Encounter: Admitting: Genetic Counselor

## 2023-06-14 ENCOUNTER — Other Ambulatory Visit

## 2023-07-05 ENCOUNTER — Other Ambulatory Visit (HOSPITAL_COMMUNITY): Payer: Self-pay

## 2023-07-06 ENCOUNTER — Other Ambulatory Visit (HOSPITAL_COMMUNITY): Payer: Self-pay

## 2023-09-12 ENCOUNTER — Other Ambulatory Visit: Payer: Self-pay | Admitting: Internal Medicine

## 2023-09-13 ENCOUNTER — Other Ambulatory Visit (HOSPITAL_COMMUNITY): Payer: Self-pay

## 2023-09-13 MED ORDER — WEGOVY 1.7 MG/0.75ML ~~LOC~~ SOAJ
1.7000 mg | SUBCUTANEOUS | 11 refills | Status: AC
  Filled 2023-09-13: qty 3, 28d supply, fill #0
  Filled 2023-11-30: qty 3, 28d supply, fill #1
  Filled 2024-01-14: qty 3, 28d supply, fill #2

## 2023-10-26 ENCOUNTER — Ambulatory Visit (HOSPITAL_BASED_OUTPATIENT_CLINIC_OR_DEPARTMENT_OTHER)
Admission: RE | Admit: 2023-10-26 | Discharge: 2023-10-26 | Disposition: A | Discharge status: Home / Self Care | Source: Ambulatory Visit | Attending: Urology | Admitting: Urology

## 2023-10-26 ENCOUNTER — Encounter: Payer: Self-pay | Admitting: Urology

## 2023-10-26 ENCOUNTER — Ambulatory Visit (INDEPENDENT_AMBULATORY_CARE_PROVIDER_SITE_OTHER): Payer: Medicare Other | Admitting: Urology

## 2023-10-26 ENCOUNTER — Other Ambulatory Visit: Payer: Self-pay

## 2023-10-26 VITALS — BP 147/89 | HR 77 | Ht 65.0 in | Wt 145.0 lb

## 2023-10-26 DIAGNOSIS — R3129 Other microscopic hematuria: Secondary | ICD-10-CM | POA: Diagnosis not present

## 2023-10-26 DIAGNOSIS — N200 Calculus of kidney: Secondary | ICD-10-CM

## 2023-10-26 LAB — MICROSCOPIC EXAMINATION

## 2023-10-26 LAB — URINALYSIS, ROUTINE W REFLEX MICROSCOPIC
Bilirubin, UA: NEGATIVE
Glucose, UA: NEGATIVE
Ketones, UA: NEGATIVE
Nitrite, UA: NEGATIVE
Protein,UA: NEGATIVE
Specific Gravity, UA: 1.02 (ref 1.005–1.030)
Urobilinogen, Ur: 0.2 mg/dL (ref 0.2–1.0)
pH, UA: 6.5 (ref 5.0–7.5)

## 2023-10-26 NOTE — Progress Notes (Signed)
 Assessment: 1. Nephrolithiasis   2. Microscopic hematuria; evaluation 1/24     Plan: I personally reviewed the KUB study from today.  The bilateral nephrolithiasis appears stable. Continue stone prevention with dietary restrictions.   Hematuria profile sent today. Return to office in 1 year with KUB  Chief Complaint:  Chief Complaint  Patient presents with   Nephrolithiasis    History of Present Illness:  Casey Conley is a 68 y.o. female who is seen for further evaluation of bilateral nephrolithiasis, history of right ureteral calculus, gross and microscopic hematuria.  She noted onset of dark-colored urine on 12/31/2021.  This began after strenuous yard work.  No dysuria or flank pain.  No other urinary symptoms. Urinalysis was from 01/01/2022 showed 0-2 WBCs, TNTC RBCs. She reported that her urine has visibly cleared since that time.  No history of UTIs or kidney stones. No recent imaging. No history of tobacco use. Urinalysis from 01/06/2022 showed TNTC RBCs. CT hematuria protocol from 01/20/2022 showed approximately 10 stones in the right kidney, mild right-sided hydronephrosis secondary to a 4 x 5 x 7 mm stone at the right UPJ, numerous stones in the left kidney, no left hydronephrosis, 4.9 cm simple cyst upper pole right kidney. At her visit on 02/09/22, she reported 2 episodes of gross hematuria again associated with more strenuous activity.  She was not having any flank pain.  No dysuria. Cystoscopy from 02/09/22 showed no urethral or bladder abnormalities. Urine cytology was negative for malignancy.  She underwent right ESL on 02/16/22 for the proximal right ureteral calculus. She has done well since the procedure, passing small fragments and gravel.  No flank pain.  No further episodes of gross hematuria. She does have occasional stress incontinence associated with coughing and sneezing.  She does not use pads on a regular basis.  Renal ultrasound from 04/01/2022 showed  bilateral renal stones, a right parapelvic cyst, no evidence of hydronephrosis.  At her visit in March 2024, she was not having any flank pain.  She had not passed any additional stone fragments.  No dysuria or gross hematuria.  Her stress incontinence had improved.   She returns today for follow-up.  No recent stone symptoms.  No flank pain, dysuria, or gross hematuria.  Portions of the above documentation were copied from a prior visit for review purposes only.   Past Medical History:  Past Medical History:  Diagnosis Date   Abnormal Pap smear of cervix    12-29-17 neg HPV HR+ 16,18/45 neg   ANEMIA-IRON DEFICIENCY 10/29/2008   Atherosclerosis    Dysuria 03/29/2009   Family history of breast cancer 03/11/2020   Family history of colon cancer 03/11/2020   Fibroid, uterine    History of fibrocystic disease of breast    OSTEOPOROSIS 10/29/2008   Osteoporosis    on Bisphosphonate for aprox 2007   Proteinuria    24 hour-normal    Past Surgical History:  Past Surgical History:  Procedure Laterality Date   BREAST LUMPECTOMY  approx 1990   benign-left   EXTRACORPOREAL SHOCK WAVE LITHOTRIPSY Right 02/16/2022   Procedure: EXTRACORPOREAL SHOCK WAVE LITHOTRIPSY (ESWL);  Surgeon: Roseann Adine PARAS., MD;  Location: [facility];  Service: Urology;  Laterality: Right;   TONGUE SURGERY     had growth removed   UTERINE FIBROID EMBOLIZATION     s/p    Allergies:  No Known Allergies  Family History:  Family History  Problem Relation Age of Onset   Arthritis Mother  Dementia Mother    Transient ischemic attack Mother    Osteoporosis Mother    Breast cancer Mother        dx 30s, dx 54s   Diabetes Father    Hyperlipidemia Father    Heart disease Father        CAD/CABG   Colon cancer Father 53   Lung cancer Father 72   Cancer Maternal Uncle        unknown type; ? colon cancer; dx 90s   Colon polyps Maternal Uncle        unknown number   Cancer Paternal Uncle         sacral chordoma, d. 105   Cancer Maternal Grandmother 58       unknown type; ? kidney cancer   Uterine cancer Paternal Grandmother        dx 60s-70s   Diabetes Paternal Grandfather    Heart disease Paternal Grandfather     Social History:  Social History   Tobacco Use   Smoking status: Never    Passive exposure: Never   Smokeless tobacco: Never  Vaping Use   Vaping status: Never Used  Substance Use Topics   Alcohol use: Not Currently   Drug use: No    ROS: Constitutional:  Negative for fever, chills, weight loss CV: Negative for chest pain, previous MI, hypertension Respiratory:  Negative for shortness of breath, wheezing, sleep apnea, frequent cough GI:  Negative for nausea, vomiting, bloody stool, GERD  Physical exam: BP (!) 147/89   Pulse 77   Ht 5' 5 (1.651 m)   Wt 145 lb (65.8 kg)   LMP 09/02/2004   BMI 24.13 kg/m  GENERAL APPEARANCE:  Well appearing, well developed, well nourished, NAD HEENT:  Atraumatic, normocephalic, oropharynx clear NECK:  Supple without lymphadenopathy or thyromegaly ABDOMEN:  Soft, non-tender, no masses EXTREMITIES:  Moves all extremities well, without clubbing, cyanosis, or edema NEUROLOGIC:  Alert and oriented x 3, normal gait, CN II-XII grossly intact MENTAL STATUS:  appropriate BACK:  Non-tender to palpation, No CVAT SKIN:  Warm, dry, and intact  Results: U/A: 0-5 WBCs, 11-30 RBCs  KUB: Bilateral nephrolithiasis appears stable in comparison to study from 9/24; no previous ureteral calculi.

## 2023-11-08 LAB — HEMATURIA PROFILE

## 2023-11-09 ENCOUNTER — Encounter: Payer: Self-pay | Admitting: Urology

## 2023-11-10 ENCOUNTER — Ambulatory Visit: Payer: Self-pay | Admitting: Urology

## 2023-11-30 ENCOUNTER — Other Ambulatory Visit (HOSPITAL_COMMUNITY): Payer: Self-pay

## 2023-11-30 ENCOUNTER — Other Ambulatory Visit: Payer: Self-pay

## 2024-01-14 ENCOUNTER — Other Ambulatory Visit (HOSPITAL_COMMUNITY): Payer: Self-pay

## 2024-03-03 LAB — HM MAMMOGRAPHY

## 2024-05-09 ENCOUNTER — Encounter: Admitting: Radiology

## 2024-05-10 ENCOUNTER — Encounter: Admitting: Radiology

## 2024-05-22 ENCOUNTER — Ambulatory Visit

## 2024-07-18 ENCOUNTER — Ambulatory Visit

## 2024-10-24 ENCOUNTER — Ambulatory Visit: Admitting: Urology
